# Patient Record
Sex: Female | Born: 1935 | Race: White | Hispanic: No | Smoking: Former smoker
Health system: Southern US, Community
[De-identification: ages and names within clinical notes are randomized; demographics above are authoritative.]

## PROBLEM LIST (undated history)

## (undated) DIAGNOSIS — J449 Chronic obstructive pulmonary disease, unspecified: Secondary | ICD-10-CM

## (undated) DIAGNOSIS — N289 Disorder of kidney and ureter, unspecified: Secondary | ICD-10-CM

## (undated) DIAGNOSIS — E119 Type 2 diabetes mellitus without complications: Secondary | ICD-10-CM

## (undated) DIAGNOSIS — I509 Heart failure, unspecified: Secondary | ICD-10-CM

## (undated) DIAGNOSIS — I219 Acute myocardial infarction, unspecified: Secondary | ICD-10-CM

## (undated) DIAGNOSIS — I639 Cerebral infarction, unspecified: Secondary | ICD-10-CM

## (undated) DIAGNOSIS — M1990 Unspecified osteoarthritis, unspecified site: Secondary | ICD-10-CM

## (undated) DIAGNOSIS — I10 Essential (primary) hypertension: Secondary | ICD-10-CM

## (undated) HISTORY — PX: CARPAL TUNNEL RELEASE: SHX101

## (undated) HISTORY — PX: CORONARY ARTERY BYPASS GRAFT: SHX141

## (undated) HISTORY — PX: BACK SURGERY: SHX140

## (undated) HISTORY — PX: ABDOMINAL HYSTERECTOMY: SHX81

---

## 2013-06-23 ENCOUNTER — Emergency Department (HOSPITAL_COMMUNITY)
Admission: EM | Admit: 2013-06-23 | Discharge: 2013-06-23 | Disposition: A | Discharge status: Home / Self Care | Payer: Medicare Other | Source: Home / Self Care | Attending: Emergency Medicine | Admitting: Emergency Medicine

## 2013-06-23 ENCOUNTER — Encounter (HOSPITAL_COMMUNITY): Payer: Self-pay | Admitting: Emergency Medicine

## 2013-06-23 DIAGNOSIS — Z7902 Long term (current) use of antithrombotics/antiplatelets: Secondary | ICD-10-CM | POA: Insufficient documentation

## 2013-06-23 DIAGNOSIS — J441 Chronic obstructive pulmonary disease with (acute) exacerbation: Secondary | ICD-10-CM | POA: Insufficient documentation

## 2013-06-23 DIAGNOSIS — Z9889 Other specified postprocedural states: Secondary | ICD-10-CM | POA: Insufficient documentation

## 2013-06-23 DIAGNOSIS — Z79899 Other long term (current) drug therapy: Secondary | ICD-10-CM | POA: Insufficient documentation

## 2013-06-23 DIAGNOSIS — Z8673 Personal history of transient ischemic attack (TIA), and cerebral infarction without residual deficits: Secondary | ICD-10-CM | POA: Insufficient documentation

## 2013-06-23 DIAGNOSIS — M129 Arthropathy, unspecified: Secondary | ICD-10-CM | POA: Insufficient documentation

## 2013-06-23 DIAGNOSIS — Z794 Long term (current) use of insulin: Secondary | ICD-10-CM | POA: Insufficient documentation

## 2013-06-23 DIAGNOSIS — I252 Old myocardial infarction: Secondary | ICD-10-CM | POA: Insufficient documentation

## 2013-06-23 DIAGNOSIS — E119 Type 2 diabetes mellitus without complications: Secondary | ICD-10-CM | POA: Insufficient documentation

## 2013-06-23 DIAGNOSIS — Z792 Long term (current) use of antibiotics: Secondary | ICD-10-CM | POA: Insufficient documentation

## 2013-06-23 DIAGNOSIS — L03313 Cellulitis of chest wall: Secondary | ICD-10-CM

## 2013-06-23 DIAGNOSIS — M549 Dorsalgia, unspecified: Secondary | ICD-10-CM | POA: Insufficient documentation

## 2013-06-23 DIAGNOSIS — I10 Essential (primary) hypertension: Secondary | ICD-10-CM | POA: Insufficient documentation

## 2013-06-23 DIAGNOSIS — I509 Heart failure, unspecified: Secondary | ICD-10-CM | POA: Insufficient documentation

## 2013-06-23 DIAGNOSIS — Z7982 Long term (current) use of aspirin: Secondary | ICD-10-CM | POA: Insufficient documentation

## 2013-06-23 DIAGNOSIS — Z87448 Personal history of other diseases of urinary system: Secondary | ICD-10-CM | POA: Insufficient documentation

## 2013-06-23 DIAGNOSIS — Z87891 Personal history of nicotine dependence: Secondary | ICD-10-CM | POA: Insufficient documentation

## 2013-06-23 DIAGNOSIS — L02219 Cutaneous abscess of trunk, unspecified: Secondary | ICD-10-CM | POA: Insufficient documentation

## 2013-06-23 DIAGNOSIS — M2550 Pain in unspecified joint: Secondary | ICD-10-CM | POA: Insufficient documentation

## 2013-06-23 HISTORY — DX: Unspecified osteoarthritis, unspecified site: M19.90

## 2013-06-23 HISTORY — DX: Heart failure, unspecified: I50.9

## 2013-06-23 HISTORY — DX: Disorder of kidney and ureter, unspecified: N28.9

## 2013-06-23 HISTORY — DX: Type 2 diabetes mellitus without complications: E11.9

## 2013-06-23 HISTORY — DX: Chronic obstructive pulmonary disease, unspecified: J44.9

## 2013-06-23 HISTORY — DX: Cerebral infarction, unspecified: I63.9

## 2013-06-23 HISTORY — DX: Essential (primary) hypertension: I10

## 2013-06-23 HISTORY — DX: Acute myocardial infarction, unspecified: I21.9

## 2013-06-23 LAB — CBC WITH DIFFERENTIAL/PLATELET
Basophils Absolute: 0.1 10*3/uL (ref 0.0–0.1)
Basophils Relative: 1 % (ref 0–1)
Eosinophils Absolute: 0.5 10*3/uL (ref 0.0–0.7)
MCH: 28.8 pg (ref 26.0–34.0)
MCHC: 32.1 g/dL (ref 30.0–36.0)
MCV: 89.5 fL (ref 78.0–100.0)
Monocytes Absolute: 1 10*3/uL (ref 0.1–1.0)
Neutrophils Relative %: 75 % (ref 43–77)
Platelets: 333 10*3/uL (ref 150–400)
RBC: 3.06 MIL/uL — ABNORMAL LOW (ref 3.87–5.11)
RDW: 14.5 % (ref 11.5–15.5)

## 2013-06-23 LAB — BASIC METABOLIC PANEL
CO2: 25 mEq/L (ref 19–32)
GFR calc Af Amer: 40 mL/min — ABNORMAL LOW (ref >90)
GFR calc non Af Amer: 35 mL/min — ABNORMAL LOW (ref >90)
Potassium: 4.8 mEq/L (ref 3.5–5.1)
Sodium: 135 mEq/L (ref 135–145)

## 2013-06-23 MED ORDER — DIPHENHYDRAMINE HCL 50 MG/ML IJ SOLN
12.5000 mg | Freq: Once | INTRAMUSCULAR | Status: AC
  Administered 2013-06-23: 12.5 mg via INTRAVENOUS
  Filled 2013-06-23: qty 1

## 2013-06-23 MED ORDER — MORPHINE SULFATE 4 MG/ML IJ SOLN: 4.0000 mg | Freq: Once | INTRAMUSCULAR | Status: DC

## 2013-06-23 MED ORDER — CLINDAMYCIN HCL 300 MG PO CAPS: 300.0000 mg | ORAL_CAPSULE | Freq: Four times a day (QID) | ORAL | Status: DC

## 2013-06-23 MED ORDER — CLINDAMYCIN HCL 150 MG PO CAPS
450.0000 mg | ORAL_CAPSULE | Freq: Once | ORAL | Status: AC
  Administered 2013-06-23: 450 mg via ORAL
  Filled 2013-06-23: qty 3

## 2013-06-23 MED ORDER — MORPHINE SULFATE 2 MG/ML IJ SOLN
2.0000 mg | Freq: Once | INTRAMUSCULAR | Status: AC
  Administered 2013-06-23: 2 mg via INTRAVENOUS
  Filled 2013-06-23: qty 1

## 2013-06-23 MED ORDER — HYDROCODONE-ACETAMINOPHEN 5-325 MG PO TABS: 1.0000 | ORAL_TABLET | ORAL | Status: DC | PRN

## 2013-06-23 NOTE — ED Provider Notes (Signed)
CSN: [id number]     Arrival date & time 06/23/13  1154 History   First MD Initiated Contact with Patient 06/23/13 1214     Chief Complaint  Patient presents with  . Mass   (Consider location/radiation/quality/duration/timing/severity/associated sxs/prior Treatment) HPI Comments: Patient is a 78 year old female who presents to the emergency department with complaint of a" mass" under her right arm. The patient states that over the past week she has noticed an increasing mass under the right arm. She states it is very painful to touch. She has some pain with certain movements of her arm in the axilla area. The patient denies fever. She's not sure about chills. He's not had nausea or vomiting are related to this. The patient has not had any operations or procedures involving the axilla. His been no drainage from this mass area. No history of injury or trauma reported.  The history is provided by the patient.    Past Medical History  Diagnosis Date  . Hypertension   . Diabetes mellitus without complication   . CHF (congestive heart failure)   . Stroke   . COPD (chronic obstructive pulmonary disease)   . Renal disorder   . Arthritis   . MI (myocardial infarction)     MI x2   Past Surgical History  Procedure Laterality Date  . Back surgery    . Cardiac surgery     History reviewed. No pertinent family history. History  Substance Use Topics  . Smoking status: Former Games developer  . Smokeless tobacco: Not on file  . Alcohol Use: No   OB History   Grav Para Term Preterm Abortions TAB SAB Ect Mult Living                 Review of Systems  Constitutional: Negative for activity change.       All ROS Neg except as noted in HPI  HENT: Negative for nosebleeds.   Eyes: Negative for photophobia and discharge.  Respiratory: Positive for shortness of breath. Negative for cough and wheezing.   Cardiovascular: Positive for chest pain. Negative for palpitations.  Gastrointestinal: Negative for  abdominal pain and blood in stool.  Genitourinary: Negative for dysuria, frequency and hematuria.  Musculoskeletal: Positive for arthralgias and back pain. Negative for neck pain.  Skin: Negative.   Neurological: Negative for dizziness, seizures and speech difficulty.  Psychiatric/Behavioral: Negative for hallucinations and confusion.    Allergies  Oxycodone; Feldene; and Tape  Home Medications   Current Outpatient Rx  Name  Route  Sig  Dispense  Refill  . acetaminophen (TYLENOL) 500 MG tablet   Oral   Take 1,000 mg by mouth every 8 (eight) hours as needed for mild pain or moderate pain.         Marland Kitchen amLODipine (NORVASC) 10 MG tablet      1 tablet daily.         Marland Kitchen aspirin EC 81 MG tablet   Oral   Take 81 mg by mouth daily.         Marland Kitchen atorvastatin (LIPITOR) 20 MG tablet      1 tablet daily.         . Cholecalciferol 5000 UNITS capsule   Oral   Take 5,000 Units by mouth daily.         . clopidogrel (PLAVIX) 75 MG tablet   Oral   Take 1 tablet by mouth daily.         . cyclobenzaprine (FLEXERIL) 5 MG tablet  Oral   Take 5 mg by mouth 3 (three) times daily as needed for muscle spasms.          . furosemide (LASIX) 80 MG tablet   Oral   Take 1 tablet by mouth daily.         . hydrALAZINE (APRESOLINE) 50 MG tablet   Oral   Take 1 tablet by mouth 3 (three) times daily.         Marland Kitchen HYDROcodone-acetaminophen (NORCO) 10-325 MG per tablet   Oral   Take 1-3 tablets by mouth daily.         . insulin glargine (LANTUS) 100 UNIT/ML injection   Subcutaneous   Inject 30 Units into the skin at bedtime.         . insulin lispro (HUMALOG) 100 UNIT/ML injection   Subcutaneous   Inject 6 Units into the skin 3 (three) times daily before meals.         Marland Kitchen levothyroxine (SYNTHROID, LEVOTHROID) 50 MCG tablet   Oral   Take 1 tablet by mouth daily.         . metoprolol tartrate (LOPRESSOR) 25 MG tablet   Oral   Take 1 tablet by mouth daily.         Marland Kitchen  neomycin-polymyxin b-dexamethasone (MAXITROL) 3.5-10000-0.1 OINT   Both Eyes   Place 1 application into both eyes 2 (two) times daily.         Marland Kitchen omeprazole (PRILOSEC) 20 MG capsule   Oral   Take 20 mg by mouth daily.         . sertraline (ZOLOFT) 25 MG tablet   Oral   Take 1 tablet by mouth daily.         . Vitamin D, Ergocalciferol, (DRISDOL) 50000 UNITS CAPS capsule   Oral   Take 50,000 Units by mouth every 7 (seven) days.         . vitamin E 400 UNIT capsule   Oral   Take 400 Units by mouth daily.         . clindamycin (CLEOCIN) 300 MG capsule   Oral   Take 1 capsule (300 mg total) by mouth 4 (four) times daily.   28 capsule   0   . HYDROcodone-acetaminophen (NORCO/VICODIN) 5-325 MG per tablet   Oral   Take 1 tablet by mouth every 4 (four) hours as needed for moderate pain.   15 tablet   0    BP 174/69  Pulse 68  Temp(Src) 98 F (36.7 C) (Oral)  Resp 18  Ht 5\' 2"  (1.575 m)  Wt 207 lb (93.895 kg)  BMI 37.85 kg/m2  SpO2 97% Physical Exam  Nursing note and vitals reviewed. Constitutional: She is oriented to person, place, and time. She appears well-developed and well-nourished.  Non-toxic appearance.  HENT:  Head: Normocephalic.  Right Ear: Tympanic membrane and external ear normal.  Left Ear: Tympanic membrane and external ear normal.  Eyes: EOM and lids are normal. Pupils are equal, round, and reactive to light.  Neck: Normal range of motion. Neck supple. Carotid bruit is not present.  Cardiovascular: Normal rate, regular rhythm, normal heart sounds, intact distal pulses and normal pulses.   Pulmonary/Chest: Breath sounds normal. No respiratory distress.  There is increased redness of the right axilla that extends to the right lateral breast. There is no nipple involvement. Is no drainage or discharge from the nipple area. The axilla area is tender.  There is a palpable mass of the axilla.  This is tender to palpation. The mass is movable. Tender and  warm to touch.  Abdominal: Soft. Bowel sounds are normal. There is no tenderness. There is no guarding.  Musculoskeletal: Normal range of motion.  Lymphadenopathy:       Head (right side): No submandibular adenopathy present.       Head (left side): No submandibular adenopathy present.    She has no cervical adenopathy.  Neurological: She is alert and oriented to person, place, and time. She has normal strength. No cranial nerve deficit or sensory deficit.  Skin: Skin is warm and dry.  Psychiatric: She has a normal mood and affect. Her speech is normal.    ED Course  Procedures (including critical care time) Labs Review Labs Reviewed  CBC WITH DIFFERENTIAL - Abnormal; Notable for the following:    WBC 15.5 (*)    RBC 3.06 (*)    Hemoglobin 8.8 (*)    HCT 27.4 (*)    Neutro Abs 11.6 (*)    All other components within normal limits  BASIC METABOLIC PANEL - Abnormal; Notable for the following:    Glucose, Bld 132 (*)    BUN 42 (*)    Creatinine, Ser 1.42 (*)    GFR calc non Af Amer 35 (*)    GFR calc Af Amer 40 (*)    All other components within normal limits   Imaging Review No results found.  EKG Interpretation   None       MDM   1. Cellulitis of chest wall    **I have reviewed nursing notes, vital signs, and all appropriate lab and imaging results for this patient.*   metabolic panel reveals the glucose to be elevated at 132, BUN elevated at 42, creatinine elevated at 1.42. The complete blood count shows a white blood cells elevated at 15,500, the hemoglobin is low at 8.8, hematocrit low at 27.4.  Question if the mass of the axilla is an abscess versus lymph nodes, versus mass. Patient seen with me by Dr. Patria Mane. Ultrasound suggests probably swollen lymph nodes in the setting of cellulitis.  Clindamycin 450 mg given to the patient for treatment of cellulitis. Prescription for Norco and clindamycin 300 mg 4 times daily given to the patient by Dr. Patria Mane. Patient is to  return in 36 hours for recheck of the cellulitis area. The patient has had the cellulitic area marked with skin markers. Patient is to return sooner if any problems or complications.  Kathie Dike, PA-C 06/23/13 1453

## 2013-06-23 NOTE — ED Notes (Addendum)
Pt on Chronic O2 3L[place], did not bring her O2 with her, taken to room and placed on O2 3L [place]. Pt's sats in triage 78% on RA, pt has softball mass under rt arm x 1 week. Pt sats improved to 95% on O2.

## 2013-06-23 NOTE — ED Notes (Signed)
Per physicians order, area under right arm and also around breast was outlined with skin marker. Area still read swollen and warm at this time. Patient in NAD. Will continue to monitor patient.

## 2013-06-23 NOTE — ED Provider Notes (Signed)
Medical screening examination/treatment/procedure(s) were conducted as a shared visit with non-physician practitioner(s) and myself.  I personally evaluated the patient during the encounter.  EKG Interpretation   None       This appears to be right lateral chest wall,axilla and right breast cellulitis. Suspected reactive lymphnodes in right axilla. abx and warm compresses. 36 hour follow up in the ER for recheck or sooner as needed  EMERGENCY DEPARTMENT US SOFT TISSUE INTERPRETATION "Study: Limited Ultrasound of the noted body part in comments below" INDICATIONS: Soft tissue infection Multiple views of the body part are obtained with a multi-frequency linear probe PERFORMED BY:  Myself IMAGES ARCHIVED?: No SIDE:Right  BODY PART:Chest Wall FINDINGS: No abcess noted LIMITATIONS:  none INTERPRETATION:  No abcess noted COMMENT:      Lyanne Co, MD 06/23/13 [phone]

## 2013-06-25 ENCOUNTER — Inpatient Hospital Stay (HOSPITAL_COMMUNITY)
Admission: EM | Admit: 2013-06-25 | Discharge: 2013-06-27 | DRG: 603 | Disposition: A | Discharge status: Home / Self Care | Payer: Medicare Other | Attending: Family Medicine | Admitting: Family Medicine

## 2013-06-25 ENCOUNTER — Encounter (HOSPITAL_COMMUNITY): Payer: Self-pay | Admitting: Emergency Medicine

## 2013-06-25 DIAGNOSIS — K219 Gastro-esophageal reflux disease without esophagitis: Secondary | ICD-10-CM | POA: Diagnosis present

## 2013-06-25 DIAGNOSIS — E119 Type 2 diabetes mellitus without complications: Secondary | ICD-10-CM

## 2013-06-25 DIAGNOSIS — J9610 Chronic respiratory failure, unspecified whether with hypoxia or hypercapnia: Secondary | ICD-10-CM | POA: Diagnosis present

## 2013-06-25 DIAGNOSIS — I2510 Atherosclerotic heart disease of native coronary artery without angina pectoris: Secondary | ICD-10-CM | POA: Diagnosis present

## 2013-06-25 DIAGNOSIS — Z8249 Family history of ischemic heart disease and other diseases of the circulatory system: Secondary | ICD-10-CM

## 2013-06-25 DIAGNOSIS — Z87891 Personal history of nicotine dependence: Secondary | ICD-10-CM

## 2013-06-25 DIAGNOSIS — E875 Hyperkalemia: Secondary | ICD-10-CM | POA: Diagnosis present

## 2013-06-25 DIAGNOSIS — D638 Anemia in other chronic diseases classified elsewhere: Secondary | ICD-10-CM | POA: Diagnosis present

## 2013-06-25 DIAGNOSIS — Z951 Presence of aortocoronary bypass graft: Secondary | ICD-10-CM

## 2013-06-25 DIAGNOSIS — N61 Mastitis without abscess: Secondary | ICD-10-CM

## 2013-06-25 DIAGNOSIS — L0390 Cellulitis, unspecified: Secondary | ICD-10-CM | POA: Diagnosis present

## 2013-06-25 DIAGNOSIS — I129 Hypertensive chronic kidney disease with stage 1 through stage 4 chronic kidney disease, or unspecified chronic kidney disease: Secondary | ICD-10-CM | POA: Diagnosis present

## 2013-06-25 DIAGNOSIS — M129 Arthropathy, unspecified: Secondary | ICD-10-CM | POA: Diagnosis present

## 2013-06-25 DIAGNOSIS — I509 Heart failure, unspecified: Secondary | ICD-10-CM | POA: Diagnosis present

## 2013-06-25 DIAGNOSIS — Z8673 Personal history of transient ischemic attack (TIA), and cerebral infarction without residual deficits: Secondary | ICD-10-CM

## 2013-06-25 DIAGNOSIS — J4489 Other specified chronic obstructive pulmonary disease: Secondary | ICD-10-CM | POA: Diagnosis present

## 2013-06-25 DIAGNOSIS — J449 Chronic obstructive pulmonary disease, unspecified: Secondary | ICD-10-CM | POA: Diagnosis present

## 2013-06-25 DIAGNOSIS — I252 Old myocardial infarction: Secondary | ICD-10-CM

## 2013-06-25 DIAGNOSIS — L03119 Cellulitis of unspecified part of limb: Secondary | ICD-10-CM | POA: Diagnosis present

## 2013-06-25 DIAGNOSIS — Z794 Long term (current) use of insulin: Secondary | ICD-10-CM

## 2013-06-25 DIAGNOSIS — IMO0002 Reserved for concepts with insufficient information to code with codable children: Principal | ICD-10-CM

## 2013-06-25 DIAGNOSIS — R1310 Dysphagia, unspecified: Secondary | ICD-10-CM | POA: Diagnosis present

## 2013-06-25 DIAGNOSIS — Z7982 Long term (current) use of aspirin: Secondary | ICD-10-CM

## 2013-06-25 DIAGNOSIS — N189 Chronic kidney disease, unspecified: Secondary | ICD-10-CM

## 2013-06-25 LAB — CBC WITH DIFFERENTIAL/PLATELET
Basophils Relative: 1 % (ref 0–1)
Eosinophils Absolute: 0.6 10*3/uL (ref 0.0–0.7)
HCT: 27.1 % — ABNORMAL LOW (ref 36.0–46.0)
Hemoglobin: 8.7 g/dL — ABNORMAL LOW (ref 12.0–15.0)
Lymphocytes Relative: 21 % (ref 12–46)
Lymphs Abs: 3 10*3/uL (ref 0.7–4.0)
MCHC: 32.1 g/dL (ref 30.0–36.0)
MCV: 88.3 fL (ref 78.0–100.0)
Monocytes Relative: 8 % (ref 3–12)
Neutro Abs: 9.3 10*3/uL — ABNORMAL HIGH (ref 1.7–7.7)
Neutrophils Relative %: 66 % (ref 43–77)
RBC: 3.07 MIL/uL — ABNORMAL LOW (ref 3.87–5.11)

## 2013-06-25 LAB — GLUCOSE, CAPILLARY: Glucose-Capillary: 175 mg/dL — ABNORMAL HIGH (ref 70–99)

## 2013-06-25 LAB — BASIC METABOLIC PANEL
BUN: 40 mg/dL — ABNORMAL HIGH (ref 6–23)
CO2: 27 mEq/L (ref 19–32)
Chloride: 95 mEq/L — ABNORMAL LOW (ref 96–112)
Creatinine, Ser: 1.65 mg/dL — ABNORMAL HIGH (ref 0.50–1.10)
GFR calc Af Amer: 34 mL/min — ABNORMAL LOW (ref >90)
Potassium: 4.5 mEq/L (ref 3.5–5.1)

## 2013-06-25 MED ORDER — SODIUM CHLORIDE 0.9 % IJ SOLN: 3.0000 mL | INTRAMUSCULAR | Status: DC | PRN

## 2013-06-25 MED ORDER — CYCLOBENZAPRINE HCL 10 MG PO TABS: 5.0000 mg | ORAL_TABLET | Freq: Three times a day (TID) | ORAL | Status: DC | PRN

## 2013-06-25 MED ORDER — AMLODIPINE BESYLATE 5 MG PO TABS
10.0000 mg | ORAL_TABLET | Freq: Every day | ORAL | Status: DC
  Administered 2013-06-25 – 2013-06-27 (×3): 10 mg via ORAL
  Filled 2013-06-25 (×3): qty 2

## 2013-06-25 MED ORDER — ONDANSETRON HCL 4 MG PO TABS
4.0000 mg | ORAL_TABLET | Freq: Four times a day (QID) | ORAL | Status: DC | PRN
  Administered 2013-06-25 – 2013-06-27 (×2): 4 mg via ORAL
  Filled 2013-06-25 (×2): qty 1

## 2013-06-25 MED ORDER — PANTOPRAZOLE SODIUM 40 MG PO TBEC
40.0000 mg | DELAYED_RELEASE_TABLET | Freq: Every day | ORAL | Status: DC
  Administered 2013-06-25 – 2013-06-26 (×2): 40 mg via ORAL
  Filled 2013-06-25: qty 1

## 2013-06-25 MED ORDER — NEOMYCIN-POLYMYXIN-DEXAMETH 3.5-10000-0.1 OP OINT
1.0000 "application " | TOPICAL_OINTMENT | Freq: Two times a day (BID) | OPHTHALMIC | Status: DC
  Administered 2013-06-25 – 2013-06-27 (×4): 1 via OPHTHALMIC
  Filled 2013-06-25: qty 3.5

## 2013-06-25 MED ORDER — ACETAMINOPHEN 325 MG PO TABS
650.0000 mg | ORAL_TABLET | Freq: Four times a day (QID) | ORAL | Status: DC | PRN
  Administered 2013-06-26 (×2): 650 mg via ORAL
  Filled 2013-06-25 (×2): qty 2

## 2013-06-25 MED ORDER — HEPARIN SODIUM (PORCINE) 5000 UNIT/ML IJ SOLN
5000.0000 [IU] | Freq: Three times a day (TID) | INTRAMUSCULAR | Status: DC
Start: 2013-06-25 — End: 2013-06-27
  Administered 2013-06-25 (×2): 5000 [IU] via SUBCUTANEOUS
  Filled 2013-06-25 (×4): qty 1

## 2013-06-25 MED ORDER — INSULIN ASPART 100 UNIT/ML ~~LOC~~ SOLN: 0.0000 [IU] | Freq: Every day | SUBCUTANEOUS | Status: DC

## 2013-06-25 MED ORDER — ACETAMINOPHEN 650 MG RE SUPP: 650.0000 mg | Freq: Four times a day (QID) | RECTAL | Status: DC | PRN

## 2013-06-25 MED ORDER — ONDANSETRON HCL 4 MG/2ML IJ SOLN: 4.0000 mg | Freq: Four times a day (QID) | INTRAMUSCULAR | Status: DC | PRN

## 2013-06-25 MED ORDER — SERTRALINE HCL 50 MG PO TABS
25.0000 mg | ORAL_TABLET | Freq: Every day | ORAL | Status: DC
  Administered 2013-06-25 – 2013-06-27 (×3): 25 mg via ORAL
  Filled 2013-06-25 (×3): qty 1

## 2013-06-25 MED ORDER — HYDROCODONE-ACETAMINOPHEN 10-325 MG PO TABS
1.0000 | ORAL_TABLET | Freq: Four times a day (QID) | ORAL | Status: DC | PRN
  Administered 2013-06-25 – 2013-06-26 (×3): 1 via ORAL
  Filled 2013-06-25 (×3): qty 1

## 2013-06-25 MED ORDER — HYDRALAZINE HCL 25 MG PO TABS
50.0000 mg | ORAL_TABLET | Freq: Three times a day (TID) | ORAL | Status: DC
  Administered 2013-06-25 – 2013-06-27 (×6): 50 mg via ORAL
  Filled 2013-06-25 (×6): qty 2

## 2013-06-25 MED ORDER — ASPIRIN EC 81 MG PO TBEC
81.0000 mg | DELAYED_RELEASE_TABLET | Freq: Every day | ORAL | Status: DC
  Administered 2013-06-25 – 2013-06-27 (×3): 81 mg via ORAL
  Filled 2013-06-25 (×3): qty 1

## 2013-06-25 MED ORDER — INSULIN ASPART 100 UNIT/ML ~~LOC~~ SOLN
0.0000 [IU] | Freq: Three times a day (TID) | SUBCUTANEOUS | Status: DC
  Administered 2013-06-25: 3 [IU] via SUBCUTANEOUS
  Administered 2013-06-26: 2 [IU] via SUBCUTANEOUS
  Administered 2013-06-26: 1 [IU] via SUBCUTANEOUS
  Administered 2013-06-27: 2 [IU] via SUBCUTANEOUS
  Administered 2013-06-27: 3 [IU] via SUBCUTANEOUS

## 2013-06-25 MED ORDER — LEVOTHYROXINE SODIUM 50 MCG PO TABS
50.0000 ug | ORAL_TABLET | Freq: Every day | ORAL | Status: DC
  Administered 2013-06-26 – 2013-06-27 (×2): 50 ug via ORAL
  Filled 2013-06-25 (×2): qty 1

## 2013-06-25 MED ORDER — FUROSEMIDE 80 MG PO TABS
80.0000 mg | ORAL_TABLET | Freq: Every day | ORAL | Status: DC
  Administered 2013-06-25 – 2013-06-27 (×3): 80 mg via ORAL
  Filled 2013-06-25 (×3): qty 2

## 2013-06-25 MED ORDER — METOPROLOL TARTRATE 25 MG PO TABS
25.0000 mg | ORAL_TABLET | Freq: Every day | ORAL | Status: DC
  Administered 2013-06-25 – 2013-06-27 (×3): 25 mg via ORAL
  Filled 2013-06-25 (×3): qty 1

## 2013-06-25 MED ORDER — INSULIN GLARGINE 100 UNIT/ML ~~LOC~~ SOLN
30.0000 [IU] | Freq: Every day | SUBCUTANEOUS | Status: DC
  Administered 2013-06-25 – 2013-06-26 (×2): 30 [IU] via SUBCUTANEOUS
  Filled 2013-06-25 (×3): qty 0.3

## 2013-06-25 MED ORDER — ATORVASTATIN CALCIUM 20 MG PO TABS
20.0000 mg | ORAL_TABLET | Freq: Every day | ORAL | Status: DC
  Administered 2013-06-25 – 2013-06-27 (×3): 20 mg via ORAL
  Filled 2013-06-25 (×3): qty 1

## 2013-06-25 MED ORDER — VANCOMYCIN HCL IN DEXTROSE 1-5 GM/200ML-% IV SOLN
1000.0000 mg | INTRAVENOUS | Status: DC
  Administered 2013-06-25 – 2013-06-26 (×2): 1000 mg via INTRAVENOUS
  Filled 2013-06-25 (×4): qty 200

## 2013-06-25 MED ORDER — SODIUM CHLORIDE 0.9 % IJ SOLN
3.0000 mL | Freq: Two times a day (BID) | INTRAMUSCULAR | Status: DC
  Administered 2013-06-25 – 2013-06-27 (×3): 3 mL via INTRAVENOUS

## 2013-06-25 MED ORDER — POLYETHYLENE GLYCOL 3350 17 G PO PACK: 17.0000 g | PACK | Freq: Every day | ORAL | Status: DC | PRN

## 2013-06-25 MED ORDER — INSULIN ASPART 100 UNIT/ML ~~LOC~~ SOLN
6.0000 [IU] | Freq: Three times a day (TID) | SUBCUTANEOUS | Status: DC
  Administered 2013-06-25 – 2013-06-26 (×3): 6 [IU] via SUBCUTANEOUS

## 2013-06-25 MED ORDER — SODIUM CHLORIDE 0.9 % IV SOLN: 250.0000 mL | INTRAVENOUS | Status: DC | PRN

## 2013-06-25 NOTE — ED Notes (Signed)
Pt seen in ED wed 12/10 and given iv antibiotics for right axilla abscess. Pt here for recheck.

## 2013-06-25 NOTE — Progress Notes (Signed)
ANTIBIOTIC CONSULT NOTE - INITIAL  Pharmacy Consult for Vancomycin Indication: cellulitis  Allergies  Allergen Reactions  . Oxycodone Itching  . Feldene [Piroxicam] Rash  . Tape Rash    Patient Measurements: Height: 5\' 2"  (157.5 cm) Weight: 207 lb (93.895 kg) IBW/kg (Calculated) : 50.1  Vital Signs: Temp: 98.2 F (36.8 C) (12/12 0849) BP: 151/38 mmHg (12/12 1045) Pulse Rate: 60 (12/12 1045) Intake/Output from previous day:   Intake/Output from this shift:    Labs:  Recent Labs  06/23/13 1220 06/25/13 0907  WBC 15.5* 14.1*  HGB 8.8* 8.7*  PLT 333 327  CREATININE 1.42* 1.65*   Estimated Creatinine Clearance: 31 ml/min (by C-G formula based on Cr of 1.65). No results found for this basename: VANCOTROUGH, VANCOPEAK, VANCORANDOM, GENTTROUGH, GENTPEAK, GENTRANDOM, TOBRATROUGH, TOBRAPEAK, TOBRARND, AMIKACINPEAK, AMIKACINTROU, AMIKACIN,  in the last 72 hours   Microbiology: No results found for this or any previous visit (from the past 720 hour(s)).  Medical History: Past Medical History  Diagnosis Date  . Hypertension   . Diabetes mellitus without complication   . CHF (congestive heart failure)     type unknown  . Stroke     eyesight affected, uses a cane  . COPD (chronic obstructive pulmonary disease)     3L [place] "PRN"  . Renal disorder   . Arthritis   . MI (myocardial infarction)     MI x2    Medications:  Scheduled:  . amLODipine  10 mg Oral Daily  . aspirin EC  81 mg Oral Daily  . atorvastatin  20 mg Oral Daily  . furosemide  80 mg Oral Daily  . heparin  5,000 Units Subcutaneous Q8H  . hydrALAZINE  50 mg Oral TID  . insulin aspart  0-5 Units Subcutaneous QHS  . insulin aspart  0-9 Units Subcutaneous TID WC  . insulin aspart  6 Units Subcutaneous TID WC  . insulin glargine  30 Units Subcutaneous QHS  . [START ON 06/26/2013] levothyroxine  50 mcg Oral QAC breakfast  . metoprolol tartrate  25 mg Oral Daily  . neomycin-polymyxin b-dexamethasone  1  application Both Eyes BID  . pantoprazole  40 mg Oral Daily  . sertraline  25 mg Oral Daily  . sodium chloride  3 mL Intravenous Q12H  . vancomycin  1,000 mg Intravenous Q24H   Assessment: 78 yo F with right axilla & breast cellulitis that has improved on oral Clindamycin.  She was admitted for IV antibiotics because of new forearm cellulitis.  Imaging negative for abscess.  Scr is increased from 2 days ago (1.42>>1.65).   Vancomycin 12/12>> Clindamycin 12/10>>12/12  Goal of Therapy:  Vancomycin trough level 10-15 mcg/ml  Plan:  Vancomycin 1gm IV q24h Check Vancomycin trough at steady state Monitor renal function and cx data   Elson Clan 06/25/2013,1:10 PM

## 2013-06-25 NOTE — Progress Notes (Signed)
Utilization Review Complete  

## 2013-06-25 NOTE — H&P (Signed)
History and Physical  Ann Harrison ZOX:[id number] DOB: 1935-05-20 DOA: 06/25/2013  Referring physician: Dr. Rubin Payor in ED PCP: Arlina Robes, MD in [place]  Chief Complaint: Recheck of infection  HPI:  78 year old woman who receives all her care in [place] seen in the emergency Department 12/10 for right axilla and breast cellulitis, placed on clindamycin. Imaging at that time was negative for abscess. She presented to the emergency department today for wound recheck. Although the area of the breast and the axilla and to improve, she appeared to have forearm cellulitis and was thus referred for admission.  She reports she has been compliant with clindamycin. She reports scratching her right forearm but no other injury. No history of serious infection. Symptoms began with itching of the breast and axilla and then the forearm within the last week. She has pain in the forearm.  In the emergency department and be afebrile with stable vital signs. Basic metabolic panel notable for creatinine 1.65, BUN 40, stable. Persistent leukocytosis, 14.1. Hemoglobin stable at 8.7 compared to laboratory studies obtained 2 days ago.  Review of Systems:  As in history of present illness  Past Medical History  Diagnosis Date  . Hypertension   . Diabetes mellitus without complication   . CHF (congestive heart failure)     type unknown  . Stroke     eyesight affected, uses a cane  . COPD (chronic obstructive pulmonary disease)     3L [place] "PRN"  . Renal disorder   . Arthritis   . MI (myocardial infarction)     MI x2    Past Surgical History  Procedure Laterality Date  . Back surgery      lumbar  . Coronary artery bypass graft      x3  . Abdominal hysterectomy    . Carpal tunnel release      right    Social History:  reports that she has quit smoking. She does not have any smokeless tobacco history on file. She reports that she does not drink alcohol or use illicit drugs.  Allergies   Allergen Reactions  . Oxycodone Itching  . Feldene [Piroxicam] Rash  . Tape Rash  tolerates Lortab  Family History  Problem Relation Age of Onset  . Heart attack Father   . Cancer Sister      Prior to Admission medications   Medication Sig Start Date End Date Taking? Authorizing Provider  acetaminophen (TYLENOL) 500 MG tablet Take 1,000 mg by mouth every 8 (eight) hours as needed for mild pain or moderate pain.   Yes Historical Provider, MD  amLODipine (NORVASC) 10 MG tablet 1 tablet daily. 06/04/13  Yes Historical Provider, MD  aspirin EC 81 MG tablet Take 81 mg by mouth daily.   Yes Historical Provider, MD  atorvastatin (LIPITOR) 20 MG tablet 1 tablet daily. 06/04/13  Yes Historical Provider, MD  Cholecalciferol 5000 UNITS capsule Take 5,000 Units by mouth daily.   Yes Historical Provider, MD  clindamycin (CLEOCIN) 300 MG capsule Take 1 capsule (300 mg total) by mouth 4 (four) times daily. 06/23/13  Yes Lyanne Co, MD  clopidogrel (PLAVIX) 75 MG tablet Take 1 tablet by mouth daily. 06/16/13  Yes Historical Provider, MD  cyclobenzaprine (FLEXERIL) 5 MG tablet Take 5 mg by mouth 3 (three) times daily as needed for muscle spasms.    Yes Historical Provider, MD  furosemide (LASIX) 80 MG tablet Take 1 tablet by mouth daily. 05/03/13  Yes Historical Provider, MD  hydrALAZINE (APRESOLINE) 50  MG tablet Take 1 tablet by mouth 3 (three) times daily. 06/04/13  Yes Historical Provider, MD  HYDROcodone-acetaminophen (NORCO) 10-325 MG per tablet Take 1-3 tablets by mouth daily. 06/04/13  Yes Historical Provider, MD  insulin glargine (LANTUS) 100 UNIT/ML injection Inject 30 Units into the skin at bedtime.   Yes Historical Provider, MD  insulin lispro (HUMALOG) 100 UNIT/ML injection Inject 6 Units into the skin 3 (three) times daily before meals.   Yes Historical Provider, MD  levothyroxine (SYNTHROID, LEVOTHROID) 50 MCG tablet Take 1 tablet by mouth daily. 06/04/13  Yes Historical Provider, MD   metoprolol tartrate (LOPRESSOR) 25 MG tablet Take 1 tablet by mouth daily. 06/04/13  Yes Historical Provider, MD  omeprazole (PRILOSEC) 20 MG capsule Take 20 mg by mouth daily.   Yes Historical Provider, MD  sertraline (ZOLOFT) 25 MG tablet Take 1 tablet by mouth daily. 06/04/13  Yes Historical Provider, MD  Vitamin D, Ergocalciferol, (DRISDOL) 50000 UNITS CAPS capsule Take 50,000 Units by mouth every 7 (seven) days.   Yes Historical Provider, MD  vitamin E 400 UNIT capsule Take 400 Units by mouth daily.   Yes Historical Provider, MD  neomycin-polymyxin b-dexamethasone (MAXITROL) 3.5-10000-0.1 OINT Place 1 application into both eyes 2 (two) times daily. 05/25/13   Historical Provider, MD   Physical Exam: Filed Vitals:   06/25/13 0847 06/25/13 0849 06/25/13 1045  BP:  138/96 151/38  Pulse:  81 60  Temp:  98.2 F (36.8 C)   Resp:  18 20  Height: 5\' 2"  (1.575 m)    Weight: 93.895 kg (207 lb)    SpO2:  97% 100%   General: Examined in the emergency department.  Appears calm and comfortable Eyes: PERRL, normal lids, irises ENT: grossly normal hearing, lips & tongue Neck: no LAD, masses or thyromegaly Cardiovascular: RRR, no m/r/g. No LE edema. Respiratory: CTA bilaterally, no w/r/r. Normal respiratory effort. Abdomen: soft, ntnd Skin: Examined with Amber, ED RN as chaperone. The right breast and axilla have been marked and there is obvious regression of erythema from borders. The breast cellulitis appears nearly resolved. There is no mass or fluctuance. There is an area of induration in the axilla which is somewhat tender but there is no fluctuance. Family member at bedside reports that this area is improved. Right upper arm appears unremarkable. Right hand appears unremarkable with grossly normal sensation. Normal radial pulse. Hand is warm and dry. The right forearm has some erythema overlying chronic skin changes as well as some bruising, however the arm is clearly warmer than the  contralateral. There is no abscess, there is mild tenderness. No fluctuance. Musculoskeletal: grossly normal tone BUE/BLE Psychiatric: grossly normal mood and affect, speech fluent and appropriate Neurologic: grossly non-focal.  Wt Readings from Last 3 Encounters:  06/25/13 93.895 kg (207 lb)  06/23/13 93.895 kg (207 lb)    Labs on Admission:  Basic Metabolic Panel:  Recent Labs Lab 06/23/13 1220 06/25/13 0907  NA 135 135  K 4.8 4.5  CL 96 95*  CO2 25 27  GLUCOSE 132* 172*  BUN 42* 40*  CREATININE 1.42* 1.65*  CALCIUM 9.2 9.3    CBC:  Recent Labs Lab 06/23/13 1220 06/25/13 0907  WBC 15.5* 14.1*  NEUTROABS 11.6* 9.3*  HGB 8.8* 8.7*  HCT 27.4* 27.1*  MCV 89.5 88.3  PLT 333 327   Principal Problem:   Cellulitis and abscess of upper arm and forearm Active Problems:   Cellulitis   Cellulitis of breast   DM type  2 (diabetes mellitus, type 2)   CKD (chronic kidney disease)   Assessment/Plan 1. Right axilla, breast, forearm cellulitis: No evidence of complicating features at this point. It appears that the breast and axilla cellulitis is improving. Forearm cellulitis appears mild. No evidence of compartment syndrome. Empiric antibiotics. Given the development of forearm cellulitis while on clindamycin and improvement of breast cellulitis, empiric vancomycin. This appears to be gram-positive infection. 2. Diabetes mellitus presumed type II: Stable. Continue Lantus, meal coverage. Sliding-scale insulin. 3. COPD, chronic respiratory failure: Continue oxygen 3 L per minute nasal cannula. 4. Chronic kidney disease, stage unknown: Suspected baseline. Monitor clinically. 5. Normocytic anemia, likely anemia of chronic disease. Repeat CBC in the morning. 6. History of coronary artery disease, stroke, CHF type unknown: Appears stable. She reports them placement 10 or more years ago. Continue Lipitor, aspirin. Resume Plavix when clearing the procedure will be needed.  Code  Status: full code  DVT prophylaxis:heparin Family Communication: Discussed with substernal at bedside Disposition Plan/Anticipated LOS: Admit, 2 days  Time spent: 55 minutes  Brendia Sacks, MD  [facility] Pager [phone] 06/25/2013, 12:21 PM

## 2013-06-25 NOTE — ED Provider Notes (Signed)
Medical screening examination/treatment/procedure(s) were conducted as a shared visit with non-physician practitioner(s) and myself.  I personally evaluated the patient during the encounter.  EKG Interpretation   None       Cellulitis coming across the breast. Suspect swollen areas in right axilla are reactive lymph nodes. No indications for I and D at this time. Bedside US used and no focal fluid collection noted. 36 hour follow up in ER  Lyanne Co, MD 06/25/13 2308

## 2013-06-25 NOTE — ED Provider Notes (Signed)
CSN: [id number]     Arrival date & time 06/25/13  4098 History   First MD Initiated Contact with Patient 06/25/13 0844     This chart was scribed for [facility] R. Rubin Payor, MD by Ellin Mayhew, ED Scribe. This patient was seen in room APA19/APA19 and the patient's care was started at 8:54 AM.  Chief Complaint  Patient presents with  . Abscess   (Consider location/radiation/quality/duration/timing/severity/associated sxs/prior Treatment)  The history is provided by the patient and a relative. No language interpreter was used.    HPI Comments: Ann Harrison is a 78 y.o. female who presents to the Emergency Department to recheck for a mass to the right axilla from two days ago. She has been given antibtiotics Her relative states that it is currently less red than it was originally; however, patient reports feeling nauseated and having indigestion since starting the antibiotics. She also denies that the size or pain have improved. She reports a possible fever last night. She denies any diarrhea. Patient denies any prior episodes.    Patient also describes a rash to the Rforearm that has since developed and is causing her concern. Patient states that the rash has been itchy and has been scratching at the area.    Past Medical History  Diagnosis Date  . Hypertension   . Diabetes mellitus without complication   . CHF (congestive heart failure)   . Stroke   . COPD (chronic obstructive pulmonary disease)   . Renal disorder   . Arthritis   . MI (myocardial infarction)     MI x2   Past Surgical History  Procedure Laterality Date  . Back surgery    . Cardiac surgery     No family history on file. History  Substance Use Topics  . Smoking status: Former Games developer  . Smokeless tobacco: Not on file  . Alcohol Use: No   OB History   Grav Para Term Preterm Abortions TAB SAB Ect Mult Living                 Review of Systems  Constitutional: Positive for fever.  Gastrointestinal: Positive  for nausea. Negative for diarrhea.       Indigestion  Skin: Positive for color change.  All other systems reviewed and are negative.    Allergies  Oxycodone; Feldene; and Tape  Home Medications   Current Outpatient Rx  Name  Route  Sig  Dispense  Refill  . acetaminophen (TYLENOL) 500 MG tablet   Oral   Take 1,000 mg by mouth every 8 (eight) hours as needed for mild pain or moderate pain.         Marland Kitchen amLODipine (NORVASC) 10 MG tablet      1 tablet daily.         Marland Kitchen aspirin EC 81 MG tablet   Oral   Take 81 mg by mouth daily.         Marland Kitchen atorvastatin (LIPITOR) 20 MG tablet      1 tablet daily.         . Cholecalciferol 5000 UNITS capsule   Oral   Take 5,000 Units by mouth daily.         . clindamycin (CLEOCIN) 300 MG capsule   Oral   Take 1 capsule (300 mg total) by mouth 4 (four) times daily.   28 capsule   0   . clopidogrel (PLAVIX) 75 MG tablet   Oral   Take 1 tablet by mouth daily.         Marland Kitchen  cyclobenzaprine (FLEXERIL) 5 MG tablet   Oral   Take 5 mg by mouth 3 (three) times daily as needed for muscle spasms.          . furosemide (LASIX) 80 MG tablet   Oral   Take 1 tablet by mouth daily.         . hydrALAZINE (APRESOLINE) 50 MG tablet   Oral   Take 1 tablet by mouth 3 (three) times daily.         Marland Kitchen HYDROcodone-acetaminophen (NORCO) 10-325 MG per tablet   Oral   Take 1-3 tablets by mouth daily.         . insulin glargine (LANTUS) 100 UNIT/ML injection   Subcutaneous   Inject 30 Units into the skin at bedtime.         . insulin lispro (HUMALOG) 100 UNIT/ML injection   Subcutaneous   Inject 6 Units into the skin 3 (three) times daily before meals.         Marland Kitchen levothyroxine (SYNTHROID, LEVOTHROID) 50 MCG tablet   Oral   Take 1 tablet by mouth daily.         . metoprolol tartrate (LOPRESSOR) 25 MG tablet   Oral   Take 1 tablet by mouth daily.         Marland Kitchen omeprazole (PRILOSEC) 20 MG capsule   Oral   Take 20 mg by mouth  daily.         . sertraline (ZOLOFT) 25 MG tablet   Oral   Take 1 tablet by mouth daily.         . Vitamin D, Ergocalciferol, (DRISDOL) 50000 UNITS CAPS capsule   Oral   Take 50,000 Units by mouth every 7 (seven) days.         . vitamin E 400 UNIT capsule   Oral   Take 400 Units by mouth daily.         Marland Kitchen neomycin-polymyxin b-dexamethasone (MAXITROL) 3.5-10000-0.1 OINT   Both Eyes   Place 1 application into both eyes 2 (two) times daily.          Triage Vitals: BP 138/96  Pulse 81  Temp(Src) 98.2 F (36.8 C)  Resp 18  Ht 5\' 2"  (1.575 m)  Wt 207 lb (93.895 kg)  BMI 37.85 kg/m2  SpO2 97% Physical Exam  Nursing note and vitals reviewed. Constitutional: She is oriented to person, place, and time. She appears well-developed and well-nourished. No distress.  HENT:  Head: Normocephalic and atraumatic.  Eyes: EOM are normal. Pupils are equal, round, and reactive to light.  Neck: Normal range of motion. Neck supple.  Cardiovascular: Normal rate, regular rhythm and normal heart sounds.   Pulmonary/Chest: She has wheezes.  Patient is on oxygen. Diffuesly harsh with mild wheezing.  Musculoskeletal:  4 cm mass in R axilla is firm and tender with erythema. Large area that is mildly erythematous on the R axilla, R chest wall, and lateral R breast that is demarkated with a pen. Medial bruise to right forearm with erythema, induration, and firmness in dorsal aspect. Various bruising on other extremities. No peripheral edema.   Neurological: She is alert and oriented to person, place, and time.  Skin: There is erythema.  Psychiatric: She has a normal mood and affect. Her behavior is normal.    ED Course  Procedures (including critical care time)  Results for orders placed during the hospital encounter of 06/25/13  CBC WITH DIFFERENTIAL      Result Value Range  WBC 14.1 (*) 4.0 - 10.5 K/uL   RBC 3.07 (*) 3.87 - 5.11 MIL/uL   Hemoglobin 8.7 (*) 12.0 - 15.0 g/dL   HCT 52.8  (*) 41.3 - 46.0 %   MCV 88.3  78.0 - 100.0 fL   MCH 28.3  26.0 - 34.0 pg   MCHC 32.1  30.0 - 36.0 g/dL   RDW 24.4  01.0 - 27.2 %   Platelets 327  150 - 400 K/uL   Neutrophils Relative % 66  43 - 77 %   Neutro Abs 9.3 (*) 1.7 - 7.7 K/uL   Lymphocytes Relative 21  12 - 46 %   Lymphs Abs 3.0  0.7 - 4.0 K/uL   Monocytes Relative 8  3 - 12 %   Monocytes Absolute 1.1 (*) 0.1 - 1.0 K/uL   Eosinophils Relative 5  0 - 5 %   Eosinophils Absolute 0.6  0.0 - 0.7 K/uL   Basophils Relative 1  0 - 1 %   Basophils Absolute 0.1  0.0 - 0.1 K/uL  BASIC METABOLIC PANEL      Result Value Range   Sodium 135  135 - 145 mEq/L   Potassium 4.5  3.5 - 5.1 mEq/L   Chloride 95 (*) 96 - 112 mEq/L   CO2 27  19 - 32 mEq/L   Glucose, Bld 172 (*) 70 - 99 mg/dL   BUN 40 (*) 6 - 23 mg/dL   Creatinine, Ser 5.36 (*) 0.50 - 1.10 mg/dL   Calcium 9.3  8.4 - 64.4 mg/dL   GFR calc non Af Amer 29 (*) >90 mL/min   GFR calc Af Amer 34 (*) >90 mL/min   No results found.  DIAGNOSTIC STUDIES: Oxygen Saturation is 97% on [place], adequate by my interpretation.    COORDINATION OF CARE: 9:00 AM-Discussed mass under R axilla getting better and also my concern for the new infection that has developed on the R forearm. Will review charts and prior visits and F/U with patient.Treatment plan discussed with patient and patient agrees.  Labs Review Labs Reviewed  CBC WITH DIFFERENTIAL - Abnormal; Notable for the following:    WBC 14.1 (*)    RBC 3.07 (*)    Hemoglobin 8.7 (*)    HCT 27.1 (*)    Neutro Abs 9.3 (*)    Monocytes Absolute 1.1 (*)    All other components within normal limits  BASIC METABOLIC PANEL - Abnormal; Notable for the following:    Chloride 95 (*)    Glucose, Bld 172 (*)    BUN 40 (*)    Creatinine, Ser 1.65 (*)    GFR calc non Af Amer 29 (*)    GFR calc Af Amer 34 (*)    All other components within normal limits   Imaging Review No results found.  EKG Interpretation   None       MDM   1.  Cellulitis    Patient in for recheck after masses or colitis of right axilla and chest wall. By description from previous visits the sounds to be stable to improving., However patient has had the development of another cellulitis on her right forearm. Her antibiotics and given her nausea and she's been unable to keep much. Her creatinine is mildly elevated, as is her white count. At this point if he should benefit from inpatient treatment of the cellulitis. Will be admitted to internal medicine.  I personally performed the services described in this documentation, which was scribed  in my presence. The recorded information has been reviewed and is accurate.     Juliet Rude. Rubin Payor, MD 06/25/13 1007

## 2013-06-26 LAB — GLUCOSE, CAPILLARY
Glucose-Capillary: 191 mg/dL — ABNORMAL HIGH (ref 70–99)
Glucose-Capillary: 148 mg/dL — ABNORMAL HIGH (ref 70–99)
Glucose-Capillary: 159 mg/dL — ABNORMAL HIGH (ref 70–99)

## 2013-06-26 LAB — HEMOGLOBIN A1C: Hgb A1c MFr Bld: 6.5 % — ABNORMAL HIGH (ref <5.7)

## 2013-06-26 LAB — BASIC METABOLIC PANEL
BUN: 43 mg/dL — ABNORMAL HIGH (ref 6–23)
CO2: 30 mEq/L (ref 19–32)
Calcium: 9.1 mg/dL (ref 8.4–10.5)
GFR calc non Af Amer: 26 mL/min — ABNORMAL LOW (ref >90)
Glucose, Bld: 194 mg/dL — ABNORMAL HIGH (ref 70–99)
Sodium: 139 mEq/L (ref 135–145)

## 2013-06-26 LAB — CBC
MCH: 28.3 pg (ref 26.0–34.0)
MCHC: 31.6 g/dL (ref 30.0–36.0)
MCV: 89.6 fL (ref 78.0–100.0)
Platelets: 300 10*3/uL (ref 150–400)

## 2013-06-26 MED ORDER — ALBUTEROL SULFATE (5 MG/ML) 0.5% IN NEBU
2.5000 mg | INHALATION_SOLUTION | RESPIRATORY_TRACT | Status: DC | PRN
Start: 2013-06-26 — End: 2013-06-27
  Administered 2013-06-26: 2.5 mg via RESPIRATORY_TRACT

## 2013-06-26 MED ORDER — PANTOPRAZOLE SODIUM 40 MG PO TBEC
40.0000 mg | DELAYED_RELEASE_TABLET | Freq: Two times a day (BID) | ORAL | Status: DC
  Administered 2013-06-26 – 2013-06-27 (×2): 40 mg via ORAL
  Filled 2013-06-26 (×3): qty 1

## 2013-06-26 MED ORDER — CALCIUM CARBONATE ANTACID 500 MG PO CHEW
1.0000 | CHEWABLE_TABLET | Freq: Three times a day (TID) | ORAL | Status: DC
  Administered 2013-06-26 – 2013-06-27 (×3): 200 mg via ORAL
  Filled 2013-06-26 (×4): qty 1

## 2013-06-26 MED ORDER — LORAZEPAM 0.5 MG PO TABS
0.5000 mg | ORAL_TABLET | Freq: Once | ORAL | Status: AC
  Administered 2013-06-26: 0.5 mg via ORAL
  Filled 2013-06-26: qty 1

## 2013-06-26 MED ORDER — ALBUTEROL SULFATE (5 MG/ML) 0.5% IN NEBU
2.5000 mg | INHALATION_SOLUTION | Freq: Four times a day (QID) | RESPIRATORY_TRACT | Status: DC
  Administered 2013-06-26 – 2013-06-27 (×3): 2.5 mg via RESPIRATORY_TRACT
  Filled 2013-06-26 (×3): qty 0.5

## 2013-06-26 MED ORDER — ALBUTEROL SULFATE (5 MG/ML) 0.5% IN NEBU
2.5000 mg | INHALATION_SOLUTION | RESPIRATORY_TRACT | Status: DC | PRN
  Filled 2013-06-26: qty 0.5

## 2013-06-26 NOTE — Progress Notes (Signed)
[facility] PROGRESS NOTE  Ann Harrison AVW:[id number] DOB: [birth year] DOA: 06/25/2013 PCP: Arlina Robes, MD  Assessment/Plan: 1. Right axilla, breast cellulitis: Continues to improve. No evidence of complicating feature or abscess. 2. Right forearm cellulitis: Appears nearly resolved.  3. GERD, dysphagia: Plan as below. 4. Diabetes mellitus type 2: Stable. Continue Lantus, meal coverage, sliding scale insulin. 5. Chronic kidney disease, stage unknown: Suspected baseline. Monitor clinically. 6. Suspected anemia of chronic disease: Hemoglobin stable. Followup as an outpatient. 7. History of coronary artery disease, stroke, CHF type unknown: These issues appear stable. Stent placement was then more years ago. I doubt she will need any type of procedure but will hold Plavix until this is certain. Continue Lipitor and aspirin.   Continue current antibiotics, likely change to oral therapy in the morning  Make Protonix twice daily. Add TUMS. Has a history of esophageal dilatation in the past. Monitor clinically. May need to consider GI consultation inpatient or outpatient.  Pending studies:   Hemoglobin A1c  Code Status: full code DVT prophylaxis: heparin Family Communication: none present Disposition Plan: home, likely 12/14  Brendia Sacks, MD  [facility]  Pager [phone] If 7PM-7AM, please contact night-coverage at [email or web address], password [facility] 06/26/2013, 10:38 AM  LOS: 1 day   Summary: 78 year old woman who receives all her care in [place] seen in the emergency Department 12/10 for right axilla and breast cellulitis, placed on clindamycin. Imaging at that time was negative for abscess. She presented to the emergency department today for wound recheck. Although the area of the breast and the axilla and to improve, she appeared to have forearm cellulitis and was thus referred for admission.  Consultants:    Procedures:    Antibiotics:  Vancomycin  12/12 >>   HPI/Subjective: Right forearm much improved. Axilla remains tender. Complains of reflux, heartburn and some dysphagia.  Objective: Filed Vitals:   06/25/13 1045 06/25/13 1451 06/25/13 2039 06/26/13 0517  BP: 151/38 144/52 127/63 147/91  Pulse: 60 65 59 57  Temp:  98.1 F (36.7 C) 98.4 F (36.9 C) 97.9 F (36.6 C)  TempSrc:  Oral Oral Oral  Resp: 20 19 20 20   Height:      Weight:      SpO2: 100% 100% 99% 97%    Intake/Output Summary (Last 24 hours) at 06/26/13 1038 Last data filed at 06/26/13 0847  Gross per 24 hour  Intake   1310 ml  Output      2 ml  Net   1308 ml     Filed Weights   06/25/13 0847  Weight: 93.895 kg (207 lb)    Exam:   Afebrile, vital signs stable. Stable hypoxia.  General: Appears calm, comfortable. Sitting in chair.  Cardiovascular: Regular rate and rhythm. No murmur, rub gallop. No lower extremity edema.  Respiratory: Clear to auscultation bilaterally. No wheezes, rales or rhonchi. Normal respiratory effort.  Right forearm: Resolution of erythema. Still warm but nontender. No fluctuance.  Right axilla demonstrates decreased erythema, decreased induration, less tender to palpation. No fluctuance appreciated.  Right breast shows near resolution of cellulitis.  Data Reviewed:  Creatinine 1.80, somewhat higher from yesterday  Hemoglobin stable 8.4.  Leukocytosis has decreased 14.1 >> 11.6  Scheduled Meds: . amLODipine  10 mg Oral Daily  . aspirin EC  81 mg Oral Daily  . atorvastatin  20 mg Oral Daily  . calcium carbonate  1 tablet Oral TID WC  . furosemide  80 mg Oral Daily  . heparin  5,000 Units  Subcutaneous Q8H  . hydrALAZINE  50 mg Oral TID  . insulin aspart  0-5 Units Subcutaneous QHS  . insulin aspart  0-9 Units Subcutaneous TID WC  . insulin aspart  6 Units Subcutaneous TID WC  . insulin glargine  30 Units Subcutaneous QHS  . levothyroxine  50 mcg Oral QAC breakfast  . metoprolol tartrate  25 mg Oral Daily  .  neomycin-polymyxin b-dexamethasone  1 application Both Eyes BID  . pantoprazole  40 mg Oral BID AC  . sertraline  25 mg Oral Daily  . sodium chloride  3 mL Intravenous Q12H  . vancomycin  1,000 mg Intravenous Q24H   Continuous Infusions:   Principal Problem:   Cellulitis and abscess of upper arm and forearm Active Problems:   Cellulitis   Cellulitis of breast   DM type 2 (diabetes mellitus, type 2)   CKD (chronic kidney disease)   Cellulitis of forearm   Time spent 15 minutes

## 2013-06-27 LAB — BASIC METABOLIC PANEL
BUN: 48 mg/dL — ABNORMAL HIGH (ref 6–23)
CO2: 26 mEq/L (ref 19–32)
Calcium: 8.9 mg/dL (ref 8.4–10.5)
Chloride: 100 mEq/L (ref 96–112)
Creatinine, Ser: 1.81 mg/dL — ABNORMAL HIGH (ref 0.50–1.10)
GFR calc Af Amer: 30 mL/min — ABNORMAL LOW (ref >90)
Potassium: 5.3 mEq/L — ABNORMAL HIGH (ref 3.5–5.1)

## 2013-06-27 LAB — GLUCOSE, CAPILLARY: Glucose-Capillary: 156 mg/dL — ABNORMAL HIGH (ref 70–99)

## 2013-06-27 MED ORDER — LOPERAMIDE HCL 2 MG PO CAPS
2.0000 mg | ORAL_CAPSULE | Freq: Once | ORAL | Status: AC
  Administered 2013-06-27: 2 mg via ORAL
  Filled 2013-06-27: qty 1

## 2013-06-27 MED ORDER — DOXYCYCLINE HYCLATE 100 MG PO TABS
100.0000 mg | ORAL_TABLET | Freq: Two times a day (BID) | ORAL | Status: DC
  Administered 2013-06-27: 100 mg via ORAL
  Filled 2013-06-27: qty 1

## 2013-06-27 MED ORDER — SODIUM POLYSTYRENE SULFONATE 15 GM/60ML PO SUSP
15.0000 g | Freq: Once | ORAL | Status: AC
  Administered 2013-06-27: 15 g via ORAL
  Filled 2013-06-27: qty 60

## 2013-06-27 MED ORDER — CEPHALEXIN 500 MG PO CAPS: 500.0000 mg | ORAL_CAPSULE | Freq: Two times a day (BID) | ORAL | Status: DC

## 2013-06-27 MED ORDER — CEPHALEXIN 500 MG PO CAPS: 500.0000 mg | ORAL_CAPSULE | Freq: Two times a day (BID) | ORAL | Status: AC

## 2013-06-27 MED ORDER — CEPHALEXIN 500 MG PO CAPS
500.0000 mg | ORAL_CAPSULE | Freq: Two times a day (BID) | ORAL | Status: DC
  Administered 2013-06-27: 500 mg via ORAL
  Filled 2013-06-27: qty 1

## 2013-06-27 MED ORDER — DOXYCYCLINE HYCLATE 100 MG PO TABS: 100.0000 mg | ORAL_TABLET | Freq: Two times a day (BID) | ORAL | Status: AC

## 2013-06-27 NOTE — Progress Notes (Signed)
[facility] PROGRESS NOTE  Ann Harrison ZOX:[id number] DOB: Apr 16, 1935 DOA: 06/25/2013 PCP: Arlina Robes, MD  Assessment/Plan: 1. Right axilla, breast cellulitis: Nearly resolved. 2. Right forearm cellulitis: Appears resolved 3. Mild hyperkalemia 4. GERD, dysphagia: Stable. Followup as an outpatient. 5. Diabetes mellitus type 2: Stable. Continue Lantus, meal coverage, sliding scale insulin. 6. Chronic kidney disease, stage unknown: Suspected baseline. Stable. Followup as an outpatient. 7. Suspected anemia of chronic disease: Hemoglobin stable. Followup as an outpatient. 8. History of coronary artery disease, stroke, CHF type unknown: These issues appear stable. Stent placement was then more years ago.  Continue Lipitor and aspirin.   Kayexalate x1  Change to oral antibiotics tetracycline Keflex  Discharge home  Followup GERD as an outpatient  Followup chronic kidney disease as an outpatient, suggest repeat basic metabolic panel in one week  Followup anemia suspected to be from chronic disease  Resume Plavix  Brendia Sacks, MD  [facility]  Pager [phone] If 7PM-7AM, please contact night-coverage at [email or web address], password [facility] 06/27/2013, 11:18 AM  LOS: 2 days   Summary: 78 year old woman who receives all her care in [place] seen in the emergency Department 12/10 for right axilla and breast cellulitis, placed on clindamycin. Imaging at that time was negative for abscess. She presented to the emergency department today for wound recheck. Although the area of the breast and the axilla and to improve, she appeared to have forearm cellulitis and was thus referred for admission.  Consultants:    Procedures:    Antibiotics:  Vancomycin 12/12 >> 12/13  Doxycycline 12/13 >> 12/18  Keflex 12/13 >> 12/18  HPI/Subjective: Feels much better. Wants to go home. Infection rapidly improving.  Objective: Filed Vitals:   06/26/13 2032 06/26/13 2347  06/27/13 0454 06/27/13 0713  BP: 128/56  130/60   Pulse: 58  62   Temp: 98.6 F (37 C)  98 F (36.7 C)   TempSrc: Oral     Resp: 20  20   Height:      Weight:      SpO2: 96% 99% 98% 98%    Intake/Output Summary (Last 24 hours) at 06/27/13 1118 Last data filed at 06/27/13 0800  Gross per 24 hour  Intake    440 ml  Output      3 ml  Net    437 ml     Filed Weights   06/25/13 0847  Weight: 93.895 kg (207 lb)    Exam:   Afebrile, vital signs stable. Stable hypoxia.  General: Appears calm and comfortable. Speech fluent and clear. Well-appearing.  Cardiovascular: Regular rate and rhythm. No murmur, rub or gallop.  Respiratory: Clear to auscultation bilaterally. No wheezes, rales or rhonchi. Normal respiratory effort.  Right forearm: Erythema has resolved, swelling has resolved. No induration or fluctuance. Nontender to palpation.  Right breast: Erythema completely resolved  Right axilla: Area of induration is markedly decreased in size, now nontender. No fluctuance.  Data Reviewed:  Potassium 5.3  BUN/creatinine stable  Scheduled Meds: . albuterol  2.5 mg Nebulization QID  . amLODipine  10 mg Oral Daily  . aspirin EC  81 mg Oral Daily  . atorvastatin  20 mg Oral Daily  . calcium carbonate  1 tablet Oral TID WC  . furosemide  80 mg Oral Daily  . heparin  5,000 Units Subcutaneous Q8H  . hydrALAZINE  50 mg Oral TID  . insulin aspart  0-5 Units Subcutaneous QHS  . insulin aspart  0-9 Units Subcutaneous TID WC  .  insulin aspart  6 Units Subcutaneous TID WC  . insulin glargine  30 Units Subcutaneous QHS  . levothyroxine  50 mcg Oral QAC breakfast  . metoprolol tartrate  25 mg Oral Daily  . neomycin-polymyxin b-dexamethasone  1 application Both Eyes BID  . pantoprazole  40 mg Oral BID AC  . sertraline  25 mg Oral Daily  . sodium chloride  3 mL Intravenous Q12H  . vancomycin  1,000 mg Intravenous Q24H   Continuous Infusions:   Principal Problem:    Cellulitis and abscess of upper arm and forearm Active Problems:   Cellulitis   Cellulitis of breast   DM type 2 (diabetes mellitus, type 2)   CKD (chronic kidney disease)   Cellulitis of forearm

## 2013-06-27 NOTE — Progress Notes (Signed)
Pt discharged home with family Pt and family refused to go home to get pt O2 Pt and family stated that the pt does not use O2 all day Pt states she uses it sometimes during the day and always at night. I educated the family on O2 use and pt went home by own choice without O2

## 2013-06-27 NOTE — Discharge Summary (Signed)
Physician Discharge Summary  Ann Herald ZOX:[id number] DOB: 02/15/1935 DOA: 06/25/2013  PCP: Arlina Robes, MD  Admit date: 06/25/2013 Discharge date: 06/27/2013  Recommendations for Outpatient Follow-up:  1. Resolution of right axilla, breast, forearm cellulitis 2. Chronic kidney disease, mild hyperkalemia, consider repeat basic metabolic panel in one week 3. GERD, dysphagia 4. Suspected anemia of chronic disease   Follow-up Information   Follow up with Arlina Robes, MD. Schedule an appointment as soon as possible for a visit in 1 week.   Specialty:  Family Medicine     Discharge Diagnoses:  1. Right axilla, breast cellulitis 2. Right forearm cellulitis 3. Mild hyperkalemia 4. GERD, dysphagia 5. Diabetes mellitus type 2 6. Chronic kidney disease stage unknown 7. Suspected anemia of chronic disease  Discharge Condition: Improved Disposition: Home  Diet recommendation: Heart healthy diabetic diet  Filed Weights   06/25/13 0847  Weight: 93.895 kg (207 lb)    History of present illness:  78 year old woman who receives all her care in [place] seen in the emergency Department 12/10 for right axilla and breast cellulitis, placed on clindamycin. Imaging at that time was negative for abscess. She presented to the emergency department today for wound recheck. Although the area of the breast and the axilla and to improve, she appeared to have forearm cellulitis and was thus referred for admission.  Hospital Course:  Ms. Harrison improved rapidly on IV vancomycin with near complete resolution of cellulitis of the right forearm, breast and axilla. No complicating features are noted in her hospitalization was uncomplicated. Because forearm cellulitis developed on clindamycin, she will be discharged home on alternative antibiotics. Individual issues as below.  1. Right axilla, breast cellulitis: Nearly resolved. Treat with vancomycin. 2. Right forearm cellulitis:  Appears resolved 3. Mild hyperkalemia 4. GERD, dysphagia: Stable. Followup as an outpatient. 5. Diabetes mellitus type 2: Stable. Continue Lantus, meal coverage, sliding scale insulin. 6. Chronic kidney disease, stage unknown: Suspected baseline. Stable. Followup as an outpatient. 7. Suspected anemia of chronic disease: Hemoglobin stable. Followup as an outpatient. 8. History of coronary artery disease, stroke, CHF type unknown: These issues appear stable. Stent placement was then more years ago. Continue Lipitor and aspirin.  Consultants: none Procedures: none Antibiotics:  Vancomycin 12/12 >> 12/13  Doxycycline 12/13 >> 12/18  Keflex 12/13 >> 12/18  Discharge Instructions  Discharge Orders   Future Orders Complete By Expires   Activity as tolerated - No restrictions  As directed    Diet - low sodium heart healthy  As directed    Diet Carb Modified  As directed    Discharge instructions  As directed    Comments:     Be sure to finish antibiotics. Call your physician or seek immediate medical attention for worsening of infection, fever or worsening of condition. Followup with your primary care physician within one week. Low potassium diet.       Medication List    STOP taking these medications       clindamycin 300 MG capsule  Commonly known as:  CLEOCIN      TAKE these medications       acetaminophen 500 MG tablet  Commonly known as:  TYLENOL  Take 1,000 mg by mouth every 8 (eight) hours as needed for mild pain or moderate pain.     amLODipine 10 MG tablet  Commonly known as:  NORVASC  1 tablet daily.     aspirin EC 81 MG tablet  Take 81 mg by mouth daily.  atorvastatin 20 MG tablet  Commonly known as:  LIPITOR  1 tablet daily.     cephALEXin 500 MG capsule  Commonly known as:  KEFLEX  Take 1 capsule (500 mg total) by mouth every 12 (twelve) hours.     Cholecalciferol 5000 UNITS capsule  Take 5,000 Units by mouth daily.     clopidogrel 75 MG tablet   Commonly known as:  PLAVIX  Take 1 tablet by mouth daily.     cyclobenzaprine 5 MG tablet  Commonly known as:  FLEXERIL  Take 5 mg by mouth 3 (three) times daily as needed for muscle spasms.     doxycycline 100 MG tablet  Commonly known as:  VIBRA-TABS  Take 1 tablet (100 mg total) by mouth every 12 (twelve) hours.     furosemide 80 MG tablet  Commonly known as:  LASIX  Take 1 tablet by mouth daily.     hydrALAZINE 50 MG tablet  Commonly known as:  APRESOLINE  Take 1 tablet by mouth 3 (three) times daily.     HYDROcodone-acetaminophen 10-325 MG per tablet  Commonly known as:  NORCO  Take 1-3 tablets by mouth daily.     insulin glargine 100 UNIT/ML injection  Commonly known as:  LANTUS  Inject 30 Units into the skin at bedtime.     insulin lispro 100 UNIT/ML injection  Commonly known as:  HUMALOG  Inject 6 Units into the skin 3 (three) times daily before meals.     levothyroxine 50 MCG tablet  Commonly known as:  SYNTHROID, LEVOTHROID  Take 1 tablet by mouth daily.     metoprolol tartrate 25 MG tablet  Commonly known as:  LOPRESSOR  Take 1 tablet by mouth daily.     neomycin-polymyxin b-dexamethasone 3.5-10000-0.1 Oint  Commonly known as:  MAXITROL  Place 1 application into both eyes 2 (two) times daily.     omeprazole 20 MG capsule  Commonly known as:  PRILOSEC  Take 20 mg by mouth daily.     sertraline 25 MG tablet  Commonly known as:  ZOLOFT  Take 1 tablet by mouth daily.     Vitamin D (Ergocalciferol) 50000 UNITS Caps capsule  Commonly known as:  DRISDOL  Take 50,000 Units by mouth every 7 (seven) days.     vitamin E 400 UNIT capsule  Take 400 Units by mouth daily.       Allergies  Allergen Reactions  . Oxycodone Itching  . Feldene [Piroxicam] Rash  . Tape Rash    The results of significant diagnostics from this hospitalization (including imaging, microbiology, ancillary and laboratory) are listed below for reference.     Labs: Basic  Metabolic Panel:  Recent Labs Lab 06/23/13 1220 06/25/13 0907 06/26/13 0518 06/27/13 0613  NA 135 135 139 137  K 4.8 4.5 4.7 5.3*  CL 96 95* 100 100  CO2 25 27 30 26   GLUCOSE 132* 172* 194* 181*  BUN 42* 40* 43* 48*  CREATININE 1.42* 1.65* 1.80* 1.81*  CALCIUM 9.2 9.3 9.1 8.9   CBC:  Recent Labs Lab 06/23/13 1220 06/25/13 0907 06/26/13 0518  WBC 15.5* 14.1* 11.6*  NEUTROABS 11.6* 9.3*  --   HGB 8.8* 8.7* 8.4*  HCT 27.4* 27.1* 26.6*  MCV 89.5 88.3 89.6  PLT 333 327 300   CBG:  Recent Labs Lab 06/25/13 2038 06/26/13 0727 06/26/13 1116 06/26/13 1619 06/26/13 2039  GLUCAP 175* 191* 148* 101* 159*    Principal Problem:   Cellulitis and abscess  of upper arm and forearm Active Problems:   Cellulitis   Cellulitis of breast   DM type 2 (diabetes mellitus, type 2)   CKD (chronic kidney disease)   Cellulitis of forearm   Time coordinating discharge: 25 minutes  Signed:  Brendia Sacks, MD [facility] 06/27/2013, 11:46 AM

## 2013-06-27 NOTE — Plan of Care (Signed)
Problem: Discharge Progression Outcomes Goal: Barriers To Progression Addressed/Resolved Outcome: Adequate for Discharge reddness to rt axillary area is subsiding  Goal: Pain controlled with appropriate interventions Outcome: Completed/Met Date Met:  06/27/13 Denies pain

## 2014-09-02 ENCOUNTER — Ambulatory Visit (HOSPITAL_COMMUNITY)
Admission: AD | Admit: 2014-09-02 | Discharge: 2014-09-02 | Disposition: A | Discharge status: Home / Self Care | Payer: Medicare Other | Source: Other Acute Inpatient Hospital | Attending: Internal Medicine | Admitting: Internal Medicine

## 2014-09-02 ENCOUNTER — Inpatient Hospital Stay
Admission: AD | Admit: 2014-09-02 | Discharge: 2014-10-14 | Disposition: E | Payer: Self-pay | Source: Ambulatory Visit | Attending: Internal Medicine | Admitting: Internal Medicine

## 2014-09-02 DIAGNOSIS — J9620 Acute and chronic respiratory failure, unspecified whether with hypoxia or hypercapnia: Secondary | ICD-10-CM

## 2014-09-02 DIAGNOSIS — I509 Heart failure, unspecified: Secondary | ICD-10-CM

## 2014-09-02 DIAGNOSIS — Z719 Counseling, unspecified: Secondary | ICD-10-CM

## 2014-09-02 DIAGNOSIS — Z452 Encounter for adjustment and management of vascular access device: Secondary | ICD-10-CM

## 2014-09-02 DIAGNOSIS — J90 Pleural effusion, not elsewhere classified: Secondary | ICD-10-CM

## 2014-09-02 DIAGNOSIS — J9690 Respiratory failure, unspecified, unspecified whether with hypoxia or hypercapnia: Secondary | ICD-10-CM

## 2014-09-02 DIAGNOSIS — J449 Chronic obstructive pulmonary disease, unspecified: Secondary | ICD-10-CM

## 2014-09-02 DIAGNOSIS — Z0189 Encounter for other specified special examinations: Secondary | ICD-10-CM

## 2014-09-03 ENCOUNTER — Other Ambulatory Visit (HOSPITAL_COMMUNITY): Payer: Self-pay

## 2014-09-03 ENCOUNTER — Other Ambulatory Visit: Payer: Self-pay

## 2014-09-03 LAB — CBC WITH DIFFERENTIAL/PLATELET
Basophils Absolute: 0 10*3/uL (ref 0.0–0.1)
Basophils Relative: 0 % (ref 0–1)
EOS ABS: 0 10*3/uL (ref 0.0–0.7)
EOS PCT: 0 % (ref 0–5)
HCT: 27.3 % — ABNORMAL LOW (ref 36.0–46.0)
Hemoglobin: 8.7 g/dL — ABNORMAL LOW (ref 12.0–15.0)
Lymphocytes Relative: 30 % (ref 12–46)
Lymphs Abs: 5.7 10*3/uL — ABNORMAL HIGH (ref 0.7–4.0)
MCH: 28.4 pg (ref 26.0–34.0)
MCHC: 31.9 g/dL (ref 30.0–36.0)
MCV: 89.2 fL (ref 78.0–100.0)
MONO ABS: 1 10*3/uL (ref 0.1–1.0)
MONOS PCT: 5 % (ref 3–12)
NEUTROS ABS: 12.4 10*3/uL — AB (ref 1.7–7.7)
NEUTROS PCT: 65 % (ref 43–77)
PLATELETS: 268 10*3/uL (ref 150–400)
RBC: 3.06 MIL/uL — AB (ref 3.87–5.11)
RDW: 15.8 % — ABNORMAL HIGH (ref 11.5–15.5)
WBC: 19.1 10*3/uL — AB (ref 4.0–10.5)

## 2014-09-03 LAB — COMPREHENSIVE METABOLIC PANEL
ALBUMIN: 3.4 g/dL — AB (ref 3.5–5.2)
ALT: 31 U/L (ref 0–35)
AST: 20 U/L (ref 0–37)
Alkaline Phosphatase: 39 U/L (ref 39–117)
Anion gap: 6 (ref 5–15)
BUN: 66 mg/dL — AB (ref 6–23)
CO2: 28 mmol/L (ref 19–32)
Calcium: 8.6 mg/dL (ref 8.4–10.5)
Chloride: 102 mmol/L (ref 96–112)
Creatinine, Ser: 1.48 mg/dL — ABNORMAL HIGH (ref 0.50–1.10)
GFR calc Af Amer: 38 mL/min — ABNORMAL LOW (ref >90)
GFR calc non Af Amer: 33 mL/min — ABNORMAL LOW (ref >90)
GLUCOSE: 136 mg/dL — AB (ref 70–99)
Potassium: 5.2 mmol/L — ABNORMAL HIGH (ref 3.5–5.1)
Sodium: 136 mmol/L (ref 135–145)
Total Bilirubin: 0.9 mg/dL (ref 0.3–1.2)
Total Protein: 5.6 g/dL — ABNORMAL LOW (ref 6.0–8.3)

## 2014-09-03 LAB — BLOOD GAS, ARTERIAL
Acid-Base Excess: 1.7 mmol/L (ref 0.0–2.0)
Bicarbonate: 27 mEq/L — ABNORMAL HIGH (ref 20.0–24.0)
Delivery systems: POSITIVE
Expiratory PAP: 8
FIO2: 0.4 %
INSPIRATORY PAP: 18
MODE: POSITIVE
O2 SAT: 95.2 %
PATIENT TEMPERATURE: 98.6
PCO2 ART: 52.3 mmHg — AB (ref 35.0–45.0)
RATE: 24 resp/min
TCO2: 28.6 mmol/L (ref 0–100)
pH, Arterial: 7.332 — ABNORMAL LOW (ref 7.350–7.450)
pO2, Arterial: 80.9 mmHg (ref 80.0–100.0)

## 2014-09-03 LAB — VANCOMYCIN, RANDOM: VANCOMYCIN RM: 10.1 ug/mL

## 2014-09-03 LAB — VITAMIN B12: Vitamin B-12: 410 pg/mL (ref 211–911)

## 2014-09-03 LAB — PROCALCITONIN: Procalcitonin: <0.1 ng/mL

## 2014-09-03 LAB — PHOSPHORUS: PHOSPHORUS: 4.5 mg/dL (ref 2.3–4.6)

## 2014-09-03 LAB — T4, FREE: Free T4: 1.14 ng/dL (ref 0.80–1.80)

## 2014-09-03 LAB — TSH: TSH: 1.315 u[IU]/mL (ref 0.350–4.500)

## 2014-09-03 LAB — MAGNESIUM: MAGNESIUM: 2.7 mg/dL — AB (ref 1.5–2.5)

## 2014-09-05 ENCOUNTER — Other Ambulatory Visit (HOSPITAL_COMMUNITY): Payer: Self-pay

## 2014-09-05 DIAGNOSIS — J9601 Acute respiratory failure with hypoxia: Secondary | ICD-10-CM

## 2014-09-05 DIAGNOSIS — J441 Chronic obstructive pulmonary disease with (acute) exacerbation: Secondary | ICD-10-CM

## 2014-09-05 DIAGNOSIS — J9811 Atelectasis: Secondary | ICD-10-CM

## 2014-09-05 LAB — BLOOD GAS, ARTERIAL
ACID-BASE DEFICIT: 1.2 mmol/L (ref 0.0–2.0)
Bicarbonate: 25.3 mEq/L — ABNORMAL HIGH (ref 20.0–24.0)
DELIVERY SYSTEMS: POSITIVE
EXPIRATORY PAP: 8
FIO2: 0.4 %
Inspiratory PAP: 18
O2 Saturation: 90.9 %
PH ART: 7.242 — AB (ref 7.350–7.450)
Patient temperature: 98.6
TCO2: 27.1 mmol/L (ref 0–100)
pCO2 arterial: 60.8 mmHg (ref 35.0–45.0)
pO2, Arterial: 67.2 mmHg — ABNORMAL LOW (ref 80.0–100.0)

## 2014-09-05 LAB — COMPREHENSIVE METABOLIC PANEL
ALBUMIN: 3.3 g/dL — AB (ref 3.5–5.2)
ALT: 22 U/L (ref 0–35)
AST: 15 U/L (ref 0–37)
Alkaline Phosphatase: 35 U/L — ABNORMAL LOW (ref 39–117)
Anion gap: 5 (ref 5–15)
BILIRUBIN TOTAL: 0.6 mg/dL (ref 0.3–1.2)
BUN: 95 mg/dL — ABNORMAL HIGH (ref 6–23)
CALCIUM: 8.4 mg/dL (ref 8.4–10.5)
CO2: 28 mmol/L (ref 19–32)
CREATININE: 2.1 mg/dL — AB (ref 0.50–1.10)
Chloride: 103 mmol/L (ref 96–112)
GFR calc Af Amer: 25 mL/min — ABNORMAL LOW (ref >90)
GFR calc non Af Amer: 22 mL/min — ABNORMAL LOW (ref >90)
GLUCOSE: 149 mg/dL — AB (ref 70–99)
POTASSIUM: 5.3 mmol/L — AB (ref 3.5–5.1)
Sodium: 136 mmol/L (ref 135–145)
TOTAL PROTEIN: 5.6 g/dL — AB (ref 6.0–8.3)

## 2014-09-05 LAB — PHOSPHORUS: PHOSPHORUS: 6.5 mg/dL — AB (ref 2.3–4.6)

## 2014-09-05 LAB — FOLATE RBC
Folate, Hemolysate: >620 ng/mL
Hematocrit: 27.3 % — ABNORMAL LOW (ref 34.0–46.6)

## 2014-09-05 LAB — MAGNESIUM: MAGNESIUM: 2.9 mg/dL — AB (ref 1.5–2.5)

## 2014-09-05 LAB — TRIGLYCERIDES: Triglycerides: 101 mg/dL (ref <150)

## 2014-09-05 NOTE — Consult Note (Signed)
Name: Ann Harrison MRN: [id number] DOB: Dec 04, 1934    ADMISSION DATE:  10/01/2014 CONSULTATION DATE:  2.22  REFERRING MD :  Hijazi   CHIEF COMPLAINT:  Respiratory failure  BRIEF PATIENT DESCRIPTION:  This is a 80 year old female with past medical history of diastolic dysfunction, hypertension, COPD, and chronic respiratory failure on 3 L. [facility] outside hospital after being treated for acute on chronic hypercarbic respiratory failure in the setting of acute exacerbation of COPD, with HCAP, and probable parapneumonic effusion. She was treated in the usual fashion which included empiric antibiotics, scheduled bronchodilators, systemic steroids, and noninvasive positive pressure ventilation. She also reportedly had a thoracentesis, unfortunately these results were not present. [facility], as she was unable to wean from noninvasive ventilation at the outside hospital. Pulmonary critical care has been asked to assess and assist with these efforts.  SIGNIFICANT EVENTS    STUDIES:     HISTORY OF PRESENT ILLNESS:   See above  PAST MEDICAL HISTORY :   has a past medical history of Hypertension; Diabetes mellitus without complication; CHF (congestive heart failure); Stroke; COPD (chronic obstructive pulmonary disease); Renal disorder; Arthritis; and MI (myocardial infarction).  has past surgical history that includes Back surgery; Coronary artery bypass graft; Abdominal hysterectomy; and Carpal tunnel release. Prior to Admission medications   Medication Sig Start Date End Date Taking? Authorizing Provider  acetaminophen (TYLENOL) 500 MG tablet Take 1,000 mg by mouth every 8 (eight) hours as needed for mild pain or moderate pain.    Historical Provider, MD  amLODipine (NORVASC) 10 MG tablet 1 tablet daily. 06/04/13   Historical Provider, MD  aspirin EC 81 MG tablet Take 81 mg by mouth  daily.    Historical Provider, MD  atorvastatin (LIPITOR) 20 MG tablet 1 tablet daily. 06/04/13   Historical Provider, MD  cephALEXin (KEFLEX) 500 MG capsule Take 1 capsule (500 mg total) by mouth every 12 (twelve) hours. 06/27/13   Standley Brooking, MD  Cholecalciferol 5000 UNITS capsule Take 5,000 Units by mouth daily.    Historical Provider, MD  clopidogrel (PLAVIX) 75 MG tablet Take 1 tablet by mouth daily. 06/16/13   Historical Provider, MD  cyclobenzaprine (FLEXERIL) 5 MG tablet Take 5 mg by mouth 3 (three) times daily as needed for muscle spasms.     Historical Provider, MD  doxycycline (VIBRA-TABS) 100 MG tablet Take 1 tablet (100 mg total) by mouth every 12 (twelve) hours. 06/27/13   Standley Brooking, MD  furosemide (LASIX) 80 MG tablet Take 1 tablet by mouth daily. 05/03/13   Historical Provider, MD  hydrALAZINE (APRESOLINE) 50 MG tablet Take 1 tablet by mouth 3 (three) times daily. 06/04/13   Historical Provider, MD  HYDROcodone-acetaminophen (NORCO) 10-325 MG per tablet Take 1-3 tablets by mouth daily. 06/04/13   Historical Provider, MD  insulin glargine (LANTUS) 100 UNIT/ML injection Inject 30 Units into the skin at bedtime.    Historical Provider, MD  insulin lispro (HUMALOG) 100 UNIT/ML injection Inject 6 Units into the skin 3 (three) times daily before meals.    Historical Provider, MD  levothyroxine (SYNTHROID, LEVOTHROID) 50 MCG tablet Take 1 tablet by mouth daily. 06/04/13   Historical Provider, MD  metoprolol tartrate (LOPRESSOR) 25 MG tablet Take 1 tablet by mouth daily. 06/04/13   Historical Provider, MD  neomycin-polymyxin b-dexamethasone (MAXITROL) 3.5-10000-0.1 OINT Place 1 application into both eyes 2 (two) times daily. 05/25/13  Historical Provider, MD  omeprazole (PRILOSEC) 20 MG capsule Take 20 mg by mouth daily.    Historical Provider, MD  sertraline (ZOLOFT) 25 MG tablet Take 1 tablet by mouth daily. 06/04/13   Historical Provider, MD  Vitamin D, Ergocalciferol,  (DRISDOL) 50000 UNITS CAPS capsule Take 50,000 Units by mouth every 7 (seven) days.    Historical Provider, MD  vitamin E 400 UNIT capsule Take 400 Units by mouth daily.    Historical Provider, MD   Allergies  Allergen Reactions  . Oxycodone Itching  . Feldene [Piroxicam] Rash  . Tape Rash    FAMILY HISTORY:  family history includes Cancer in her sister; Heart attack in her father. SOCIAL HISTORY:  reports that she has quit smoking. She does not have any smokeless tobacco history on file. She reports that she does not drink alcohol or use illicit drugs.  REVIEW OF SYSTEMS:   unable  SUBJECTIVE:  Labor breathing on noninvasive support VITAL SIGNS: reviewed  PHYSICAL EXAMINATION: General:  Chronically ill-appearing white female currently on noninvasive positive pressure ventilation Neuro:  Awake, alert, no focal deficits, however generalized weakness HEENT:  Neck is large, BiPAP mask in place, no clear jugular venous distention Cardiovascular:  Regular rate and rhythm Lungs:  Very diminished on the right specifically, has crackles in the left. She has prolonged expiratory wheezes throughout, right greater than left. She does demonstrate accessory muscle use in spite of noninvasive positive pressure support Abdomen:  Obese, no organomegaly, positive bowel sounds Musculoskeletal:  Generalized weakness Skin:  Dry, scattered area of ecchymosis, no significant edema   Recent Labs Lab 09/03/14 0829  NA 136  K 5.2*  CL 102  CO2 28  BUN 66*  CREATININE 1.48*  GLUCOSE 136*    Recent Labs Lab 09/03/14 0829  HGB 8.7*  HCT 27.3*  WBC 19.1*  PLT 268   No results found. Chest x-ray review 2/20: Probable pulmonary edema, right lower lobe atelectasis/collapse, probable loculated right effusion.  ASSESSMENT / PLAN: Acute on chronic hypercarbic respiratory failure Healthcare associated pneumonia Loculated right pleural effusion Right lower lobe atelectasis/Collapse Acute  exacerbation of chronic obstructive pulmonary disease Diastolic heart dysfunction Hyperkalemia Acute kidney injury Anemia of critical illness Diabetes Deconditioning  Discussion This is a very debilitated 80 year old female who presents with acute on chronic respiratory failure, and inability to wean from noninvasive positive pressure ventilation in the setting of healthcare associated pneumonia, acute exacerbation of chronic obstructive pulmonary disease, and right lower lobe atelectasis/collapse. She also appears to have a loculated right effusion. She's been treated appropriately with empiric antibiotics, scheduled bronchodilators, diuresis, and systemic steroids. She is even had reportedly a right thoracentesis, unfortunately these results are not present. In spite of all these therapies she remains on non invasive positive pressure ventilation. I am concerned that she may require intubation, if she desires to continue therapy. The primary services discussed this with the family, who have indicated that this is not a route she would like to go, However she remains a full code.  Recommendation We will repeat an arterial blood gas, and chest x-ray Continue current antibiotics Continue scheduled systemic steroids Continue scheduled bronchodilators We will need to further discuss goals of care with the family. Specifically intubation, understanding high risk of prolonged ventilation, possibility of tracheostomy, and worsening deconditioning. If intubated, might benefit from bronchoscopy in order to recruit right lower lobe. Interventional radiology has been consult to evaluate the right pleural effusion. We will follow that fluid analysis.  Simonne MartinetPeter E Babcock  ACNP-BC [facility] Pulmonary/Critical Care Pager # [phone] OR # [phone] if no answer  Attending:  I have seen and examined the patient with nurse practitioner/resident and agree with the note above.   On my exam she is tachypneic on  BIPAP, lung exam with mild wheezing, poor airmovement, diminished breath sounds in bases  Chart reviewed from [facility] as well as report from CT chest which showed RUL and RML GGO consistent with pneumonia as well as right lower lobe narrowing.  She currently has right lower lobe collapse on her most recent chest x-rays which is likely due to mucous plugging. She is some wheezing, but I believe that the majority of her problem is chronic COPD acutely compromised by right lower lobe collapse. I explained to her today that intubation with suctioning and possibly a bronchoscopy would be the optimal way to treat this, however she is not sure she wants to go on the ventilator at this point. I also explained to her that I cannot guarantee that she would be able to be extubated considering her multiple severe comorbid illnesses.  At this point, we will continue with aggressive supportive care including Mucomyst, and chest PT. We will continue the ongoing conversation regarding CODE STATUS with her family. I tried to call her son Cecelia Byars this morning but he did not answer the phone. Apparently there is a planned conversation tomorrow morning. In the meantime, we will continue aggressive measures with BiPAP and intubate if needed.  Independent critical care time by me 45 minutes  Heber [place], MD [place] [facility] Pager: [phone] Cell: [phone] If no response, call [phone]    09/05/2014, 8:57 AM

## 2014-09-06 LAB — CBC
HCT: 25.6 % — ABNORMAL LOW (ref 36.0–46.0)
HEMOGLOBIN: 8.1 g/dL — AB (ref 12.0–15.0)
MCH: 28.5 pg (ref 26.0–34.0)
MCHC: 31.6 g/dL (ref 30.0–36.0)
MCV: 90.1 fL (ref 78.0–100.0)
Platelets: 211 10*3/uL (ref 150–400)
RBC: 2.84 MIL/uL — ABNORMAL LOW (ref 3.87–5.11)
RDW: 15.8 % — ABNORMAL HIGH (ref 11.5–15.5)
WBC: 16.7 10*3/uL — ABNORMAL HIGH (ref 4.0–10.5)

## 2014-09-06 LAB — BASIC METABOLIC PANEL
Anion gap: 7 (ref 5–15)
BUN: 103 mg/dL — ABNORMAL HIGH (ref 6–23)
CALCIUM: 8.1 mg/dL — AB (ref 8.4–10.5)
CO2: 24 mmol/L (ref 19–32)
CREATININE: 2.2 mg/dL — AB (ref 0.50–1.10)
Chloride: 103 mmol/L (ref 96–112)
GFR calc Af Amer: 24 mL/min — ABNORMAL LOW (ref >90)
GFR calc non Af Amer: 20 mL/min — ABNORMAL LOW (ref >90)
GLUCOSE: 249 mg/dL — AB (ref 70–99)
Potassium: 5.8 mmol/L — ABNORMAL HIGH (ref 3.5–5.1)
Sodium: 134 mmol/L — ABNORMAL LOW (ref 135–145)

## 2014-09-07 ENCOUNTER — Other Ambulatory Visit (HOSPITAL_COMMUNITY): Payer: Self-pay

## 2014-09-07 DIAGNOSIS — J9809 Other diseases of bronchus, not elsewhere classified: Secondary | ICD-10-CM

## 2014-09-07 DIAGNOSIS — J449 Chronic obstructive pulmonary disease, unspecified: Secondary | ICD-10-CM

## 2014-09-07 LAB — BLOOD GAS, ARTERIAL
Acid-base deficit: 3.6 mmol/L — ABNORMAL HIGH (ref 0.0–2.0)
Bicarbonate: 22.9 mEq/L (ref 20.0–24.0)
FIO2: 0.5 %
O2 Saturation: 93.8 %
PATIENT TEMPERATURE: 98.6
PEEP/CPAP: 10 cmH2O
PIP: 35 cmH2O
RATE: 20 resp/min
TCO2: 24.7 mmol/L (ref 0–100)
pCO2 arterial: 57.1 mmHg (ref 35.0–45.0)
pH, Arterial: 7.228 — ABNORMAL LOW (ref 7.350–7.450)
pO2, Arterial: 74.3 mmHg — ABNORMAL LOW (ref 80.0–100.0)

## 2014-09-07 LAB — BASIC METABOLIC PANEL
ANION GAP: 13 (ref 5–15)
BUN: 123 mg/dL — ABNORMAL HIGH (ref 6–23)
CHLORIDE: 98 mmol/L (ref 96–112)
CO2: 27 mmol/L (ref 19–32)
Calcium: 8.1 mg/dL — ABNORMAL LOW (ref 8.4–10.5)
Creatinine, Ser: 3.09 mg/dL — ABNORMAL HIGH (ref 0.50–1.10)
GFR calc non Af Amer: 14 mL/min — ABNORMAL LOW (ref >90)
GFR, EST AFRICAN AMERICAN: 16 mL/min — AB (ref >90)
Glucose, Bld: 389 mg/dL — ABNORMAL HIGH (ref 70–99)
POTASSIUM: 5.3 mmol/L — AB (ref 3.5–5.1)
Sodium: 138 mmol/L (ref 135–145)

## 2014-09-07 NOTE — Procedures (Signed)
Intubation Procedure Note Ann PerchesMildred Harrison [id number] 11/01/1934  Procedure: Intubation Indications: Respiratory insufficiency  Procedure Details Consent: Risks of procedure as well as the alternatives and risks of each were explained to the (patient/caregiver).  Consent for procedure obtained. Time Out: Verified patient identification, verified procedure, site/side was marked, verified correct patient position, special equipment/implants available, medications/allergies/relevent history reviewed, required imaging and test results available.  Performed  Drugs Etomidate 20mg , Vecuronium 8mg  DL x 1 with GS 3 blade Grade 1 view 7.5 ET tube passed through cords under direct visualization, secured at 23cm Placement confirmed with bilateral breath sounds, positive EtCO2 change and smoke in tube   Evaluation Hemodynamic Status: BP stable throughout; O2 sats: stable throughout Patient's Current Condition: stable Complications: No apparent complications Patient did tolerate procedure well. Chest X-ray ordered to verify placement.  CXR: pending.   Max FickleMCQUAID, DOUGLAS 09/07/2014

## 2014-09-07 NOTE — Progress Notes (Signed)
Name: Ann Harrison MRN: [id number] DOB: 29-Aug-1934    ADMISSION DATE:  08/31/2014 CONSULTATION DATE:  2.22  REFERRING MD :  Hijazi   CHIEF COMPLAINT:  Respiratory failure  BRIEF PATIENT DESCRIPTION:  This is a 80 year old female with past medical history of diastolic dysfunction, hypertension, COPD, and chronic respiratory failure on 3 L. [facility] outside hospital after being treated for acute on chronic hypercarbic respiratory failure in the setting of acute exacerbation of COPD, with HCAP, and probable parapneumonic effusion. She was treated in the usual fashion which included empiric antibiotics, scheduled bronchodilators, systemic steroids, and noninvasive positive pressure ventilation. She also reportedly had a thoracentesis, unfortunately these results were not present. [facility], as she was unable to wean from noninvasive ventilation at the outside hospital. Pulmonary critical care has been asked to assess and assist with these efforts.  SIGNIFICANT EVENTS  2/24 Intubation   STUDIES:      SUBJECTIVE:  Called to bedside emergently for intubation  VITAL SIGNS: reviewed  PHYSICAL EXAMINATION: General: encephalopathic on vent HEENT: BIPAP mask in place PULM: diminished breath sounds bilaterall Ext: warm, well perfused AB: slightly distended, paradoxical movement Neuro: somnolent, non-arousable to external stimuli Derm: no rash or skin breakdown   Recent Labs Lab 09/05/14 1356 09/06/14 0725 09/07/14 0645  NA 136 134* 138  K 5.3* 5.8* 5.3*  CL 103 103 98  CO2 28 24 27   BUN 95* 103* 123*  CREATININE 2.10* 2.20* 3.09*  GLUCOSE 149* 249* 389*    Recent Labs Lab 09/03/14 0829 09/06/14 0836  HGB 8.7* 8.1*  HCT 27.3*  27.3* 25.6*  WBC 19.1* 16.7*  PLT 268 211   Dg Chest Port 1 View  09/05/2014   CLINICAL DATA:  80 year old female PICC line placement.  Initial encounter.  EXAM: PORTABLE CHEST - 1 VIEW  COMPARISON:  1020 hours today and earlier.  FINDINGS: Portable AP semi upright view at at 1330 hours. Right upper extremity approach PICC line terminates just below the carina. No pneumothorax. Stable cardiac size and mediastinal contours. Right pleural effusion re - identified. Ventilation is stable.  IMPRESSION: 1. Right PICC line placed, tip at the SVC level. 2. Otherwise stable chest.   Electronically Signed   By: Odessa FlemingH  Hall M.D.   On: 09/05/2014 14:11   Chest x-ray review 2/20: Probable pulmonary edema, right lower lobe atelectasis/collapse, probable loculated right effusion.  ASSESSMENT / PLAN: Acute on chronic hypercarbic respiratory failure > worse 2/24 due to hypercapnea and mucus plugging Healthcare associated pneumonia Loculated right pleural effusion Right lower lobe atelectasis/Collapse Acute exacerbation of chronic obstructive pulmonary disease Diastolic heart dysfunction Hyperkalemia Acute kidney injury Anemia of critical illness Diabetes Deconditioning  Discussion See previous notes.  She is more hypercapnic, in obvious respiratory failure due to severe COPD and atelectasis of the RLL.  She failed mucomyst, mucinex and conservative measures.   I explained to her son Sharon SellerLloyd that her chronic respiratory failure is severe and I cannot guarantee that she will come off the vent.  He voiced understanding and stated that he would not want her to be on the ventilator longer than 10 days.  Recommendation Stat intubation Full vent support Continue bronchodilators Bag lavage/pulm toilette q shift If no improvement in CXR, will bronch by the end of the week OG placement> start tube feedings   Independent critical care time by me 35 minutes  Heber CarolinaBrent McQuaid, MD [facility]  Pager: [phone] Cell: [phone] If no response, call [phone]    09/07/2014, 11:25 AM

## 2014-09-08 ENCOUNTER — Other Ambulatory Visit (HOSPITAL_COMMUNITY): Payer: Self-pay

## 2014-09-08 ENCOUNTER — Encounter: Payer: Self-pay | Admitting: Radiology

## 2014-09-08 DIAGNOSIS — J90 Pleural effusion, not elsewhere classified: Secondary | ICD-10-CM

## 2014-09-08 LAB — BASIC METABOLIC PANEL
ANION GAP: 10 (ref 5–15)
BUN: 115 mg/dL — ABNORMAL HIGH (ref 6–23)
CO2: 26 mmol/L (ref 19–32)
Calcium: 8.4 mg/dL (ref 8.4–10.5)
Chloride: 106 mmol/L (ref 96–112)
Creatinine, Ser: 2.46 mg/dL — ABNORMAL HIGH (ref 0.50–1.10)
GFR calc Af Amer: 21 mL/min — ABNORMAL LOW (ref >90)
GFR calc non Af Amer: 18 mL/min — ABNORMAL LOW (ref >90)
GLUCOSE: 137 mg/dL — AB (ref 70–99)
POTASSIUM: 3.9 mmol/L (ref 3.5–5.1)
Sodium: 142 mmol/L (ref 135–145)

## 2014-09-08 LAB — GLUCOSE, CAPILLARY: Glucose-Capillary: 178 mg/dL — ABNORMAL HIGH (ref 70–99)

## 2014-09-08 LAB — CBC
HCT: 24.4 % — ABNORMAL LOW (ref 36.0–46.0)
Hemoglobin: 8.4 g/dL — ABNORMAL LOW (ref 12.0–15.0)
MCH: 29.2 pg (ref 26.0–34.0)
MCHC: 34.4 g/dL (ref 30.0–36.0)
MCV: 84.7 fL (ref 78.0–100.0)
Platelets: 144 10*3/uL — ABNORMAL LOW (ref 150–400)
RBC: 2.88 MIL/uL — ABNORMAL LOW (ref 3.87–5.11)
RDW: 15.9 % — AB (ref 11.5–15.5)
WBC: 22.5 10*3/uL — ABNORMAL HIGH (ref 4.0–10.5)

## 2014-09-08 NOTE — Progress Notes (Signed)
Name: Ann PerchesMildred Strole MRN: [id number] DOB: 21-Nov-1934    ADMISSION DATE:  06/30/2015 CONSULTATION DATE:  2.22  REFERRING MD :  Hijazi   CHIEF COMPLAINT:  Respiratory failure  BRIEF PATIENT DESCRIPTION:  This is a 80 year old female with past medical history of diastolic dysfunction, hypertension, COPD, and chronic respiratory failure on 3 L. [facility] outside hospital after being treated for acute on chronic hypercarbic respiratory failure in the setting of acute exacerbation of COPD, with HCAP, and probable parapneumonic effusion. She was treated in the usual fashion which included empiric antibiotics, scheduled bronchodilators, systemic steroids, and noninvasive positive pressure ventilation. She also reportedly had a thoracentesis, unfortunately these results were not present. [facility], as she was unable to wean from noninvasive ventilation at the outside hospital. Pulmonary critical care has been asked to assess and assist with these efforts.  SIGNIFICANT EVENTS  2/24 Intubation   STUDIES:  2/25 CT chest >>    SUBJECTIVE:  Intubated yesterday, agitated on vent, uncomfortable  VITAL SIGNS: Reviewed, Tm 99.5  PHYSICAL EXAMINATION: General: agitated on vent HEENT: NCAT ETT in place, OP with excoriation PULM: wheeze left, diminished right CV: Brady, no mgr Ext: warm, well perfused, some edema AB: BS+, soft, nontender Neuro: agitation on vent, moves all four ext Derm: no rash or skin breakdown   Recent Labs Lab 09/06/14 0725 09/07/14 0645 09/08/14 0647  NA 134* 138 142  K 5.8* 5.3* 3.9  CL 103 98 106  CO2 24 27 26   BUN 103* 123* 115*  CREATININE 2.20* 3.09* 2.46*  GLUCOSE 249* 389* 137*    Recent Labs Lab 09/03/14 0829 09/06/14 0836 09/08/14 0647  HGB 8.7* 8.1* 8.4*  HCT 27.3*  27.3* 25.6* 24.4*  WBC 19.1* 16.7* 22.5*  PLT 268 211 144*   Dg Chest Port 1  View  09/08/2014   CLINICAL DATA:  Hypoxia  EXAM: PORTABLE CHEST - 1 VIEW  COMPARISON:  September 07, 2014  FINDINGS: Endotracheal tube tip is 4.9 cm above the carina. Central catheter tip is in the superior vena cava. No pneumothorax. There is right lower lobe consolidation with right effusion, stable. Left lung is clear. Previously noted small left effusion no longer appreciable. Heart is mildly enlarged with pulmonary vascularity within normal limits. No adenopathy.  IMPRESSION: Tube and catheter positions as described without pneumothorax. Persistent right lower lobe consolidation with right effusion, stable. Left lung clear. No change in cardiac silhouette.   Electronically Signed   By: Bretta BangWilliam  Woodruff III M.D.   On: 09/08/2014 08:03   Dg Chest Port 1 View  09/07/2014   CLINICAL DATA:  Hypoxia  EXAM: PORTABLE CHEST - 1 VIEW  COMPARISON:  September 05, 2014  FINDINGS: Endotracheal tube tip is 3.9 cm above the carina. Central catheter tip is in the superior vena cava. No pneumothorax. There is airspace consolidation in the right lower lobe with right effusion. There is a small left effusion. Left lung is otherwise clear. Heart is mildly enlarged with pulmonary vascularity within normal limits. Patient is status post coronary artery bypass grafting.  IMPRESSION: Tube and catheter positions as described without pneumothorax. Right lower lobe consolidation with right effusion. New small left effusion. No change in cardiac silhouette.   Electronically Signed   By: Bretta BangWilliam  Woodruff III M.D.   On: 09/07/2014 13:52   Chest x-ray review 2/25: likely right lung pleural effusion vs infiltrate RLLL  ASSESSMENT /  PLAN: Acute on chronic hypercarbic respiratory failure > worse 2/24 due to hypercapnea and mucus plugging? CXR now looks like recurrent pleural effusion Healthcare associated pneumonia Loculated right pleural effusion > seems worse 2/55 Right lower lobe atelectasis/Collapse Acute exacerbation of  chronic obstructive pulmonary disease Diastolic heart dysfunction Acute kidney injury Anemia of critical illness Diabetes Deconditioning  Discussion Intubated 2/24 per family request.  I explained that she has severe COPD and multi-organ failure with a poor prognosis.  They don't want to prolong mechanical ventilation longer than 10 days  CXR on 2/25 looks more like effusion > needs CT scan  Recommendation Continue vent support/propofol for sedation CT Chest now > if enlarging effusion would consult IR for thoracentesis D/C Mucomyst Full vent support Continue bronchodilators Bag lavage/pulm toilette q shift If no improvement in CXR, will bronch by the end of the week OG placement> start tube feedings   Independent critical care time by me 35 minutes  Heber [place], MD [place] [facility] Pager: [phone] Cell: [phone] If no response, call [phone]    09/08/2014, 11:11 AM

## 2014-09-09 ENCOUNTER — Other Ambulatory Visit (HOSPITAL_COMMUNITY): Payer: Self-pay

## 2014-09-10 ENCOUNTER — Other Ambulatory Visit (HOSPITAL_COMMUNITY): Payer: Self-pay

## 2014-09-10 LAB — LIPASE, BLOOD: LIPASE: 21 U/L (ref 11–59)

## 2014-09-10 LAB — BASIC METABOLIC PANEL
Anion gap: 7 (ref 5–15)
BUN: 82 mg/dL — ABNORMAL HIGH (ref 6–23)
CALCIUM: 8 mg/dL — AB (ref 8.4–10.5)
CHLORIDE: 114 mmol/L — AB (ref 96–112)
CO2: 24 mmol/L (ref 19–32)
Creatinine, Ser: 1.67 mg/dL — ABNORMAL HIGH (ref 0.50–1.10)
GFR calc Af Amer: 33 mL/min — ABNORMAL LOW (ref >90)
GFR calc non Af Amer: 28 mL/min — ABNORMAL LOW (ref >90)
Glucose, Bld: 294 mg/dL — ABNORMAL HIGH (ref 70–99)
Potassium: 4.7 mmol/L (ref 3.5–5.1)
Sodium: 145 mmol/L (ref 135–145)

## 2014-09-10 LAB — CBC
HEMATOCRIT: 24.7 % — AB (ref 36.0–46.0)
Hemoglobin: 8 g/dL — ABNORMAL LOW (ref 12.0–15.0)
MCH: 29.2 pg (ref 26.0–34.0)
MCHC: 32.4 g/dL (ref 30.0–36.0)
MCV: 90.1 fL (ref 78.0–100.0)
Platelets: 117 10*3/uL — ABNORMAL LOW (ref 150–400)
RBC: 2.74 MIL/uL — AB (ref 3.87–5.11)
RDW: 16.8 % — AB (ref 11.5–15.5)
WBC: 21.3 10*3/uL — AB (ref 4.0–10.5)

## 2014-09-10 LAB — HEPATIC FUNCTION PANEL
ALBUMIN: 2.9 g/dL — AB (ref 3.5–5.2)
ALT: 16 U/L (ref 0–35)
AST: 19 U/L (ref 0–37)
Alkaline Phosphatase: 31 U/L — ABNORMAL LOW (ref 39–117)
BILIRUBIN TOTAL: 0.4 mg/dL (ref 0.3–1.2)
Bilirubin, Direct: 0.1 mg/dL (ref 0.0–0.5)
Indirect Bilirubin: 0.3 mg/dL (ref 0.3–0.9)
Total Protein: 5.5 g/dL — ABNORMAL LOW (ref 6.0–8.3)

## 2014-09-11 ENCOUNTER — Other Ambulatory Visit (HOSPITAL_COMMUNITY): Payer: Self-pay

## 2014-09-11 LAB — CBC
HCT: 25.7 % — ABNORMAL LOW (ref 36.0–46.0)
Hemoglobin: 8.2 g/dL — ABNORMAL LOW (ref 12.0–15.0)
MCH: 29.1 pg (ref 26.0–34.0)
MCHC: 31.9 g/dL (ref 30.0–36.0)
MCV: 91.1 fL (ref 78.0–100.0)
Platelets: 97 10*3/uL — ABNORMAL LOW (ref 150–400)
RBC: 2.82 MIL/uL — AB (ref 3.87–5.11)
RDW: 17.2 % — ABNORMAL HIGH (ref 11.5–15.5)
WBC: 29.2 10*3/uL — ABNORMAL HIGH (ref 4.0–10.5)

## 2014-09-11 LAB — BASIC METABOLIC PANEL
Anion gap: 11 (ref 5–15)
BUN: 76 mg/dL — ABNORMAL HIGH (ref 6–23)
CO2: 27 mmol/L (ref 19–32)
CREATININE: 1.73 mg/dL — AB (ref 0.50–1.10)
Calcium: 8.5 mg/dL (ref 8.4–10.5)
Chloride: 111 mmol/L (ref 96–112)
GFR calc non Af Amer: 27 mL/min — ABNORMAL LOW (ref >90)
GFR, EST AFRICAN AMERICAN: 32 mL/min — AB (ref >90)
GLUCOSE: 171 mg/dL — AB (ref 70–99)
POTASSIUM: 5 mmol/L (ref 3.5–5.1)
SODIUM: 149 mmol/L — AB (ref 135–145)

## 2014-09-11 LAB — BRAIN NATRIURETIC PEPTIDE: B NATRIURETIC PEPTIDE 5: 468.6 pg/mL — AB (ref 0.0–100.0)

## 2014-09-12 DIAGNOSIS — J449 Chronic obstructive pulmonary disease, unspecified: Secondary | ICD-10-CM

## 2014-09-12 DIAGNOSIS — J9620 Acute and chronic respiratory failure, unspecified whether with hypoxia or hypercapnia: Secondary | ICD-10-CM

## 2014-09-12 LAB — COMPREHENSIVE METABOLIC PANEL
ALT: 17 U/L (ref 0–35)
ANION GAP: 8 (ref 5–15)
AST: 17 U/L (ref 0–37)
Albumin: 2.3 g/dL — ABNORMAL LOW (ref 3.5–5.2)
Alkaline Phosphatase: 35 U/L — ABNORMAL LOW (ref 39–117)
BILIRUBIN TOTAL: 0.8 mg/dL (ref 0.3–1.2)
BUN: 80 mg/dL — ABNORMAL HIGH (ref 6–23)
CHLORIDE: 113 mmol/L — AB (ref 96–112)
CO2: 26 mmol/L (ref 19–32)
CREATININE: 1.88 mg/dL — AB (ref 0.50–1.10)
Calcium: 8.1 mg/dL — ABNORMAL LOW (ref 8.4–10.5)
GFR, EST AFRICAN AMERICAN: 29 mL/min — AB (ref >90)
GFR, EST NON AFRICAN AMERICAN: 25 mL/min — AB (ref >90)
Glucose, Bld: 167 mg/dL — ABNORMAL HIGH (ref 70–99)
POTASSIUM: 4.4 mmol/L (ref 3.5–5.1)
SODIUM: 147 mmol/L — AB (ref 135–145)
Total Protein: 5 g/dL — ABNORMAL LOW (ref 6.0–8.3)

## 2014-09-12 LAB — CBC WITH DIFFERENTIAL/PLATELET
BASOS ABS: 0 10*3/uL (ref 0.0–0.1)
BASOS PCT: 0 % (ref 0–1)
EOS PCT: 0 % (ref 0–5)
Eosinophils Absolute: 0 10*3/uL (ref 0.0–0.7)
HCT: 23.3 % — ABNORMAL LOW (ref 36.0–46.0)
Hemoglobin: 7.5 g/dL — ABNORMAL LOW (ref 12.0–15.0)
LYMPHS ABS: 2.4 10*3/uL (ref 0.7–4.0)
Lymphocytes Relative: 12 % (ref 12–46)
MCH: 28.5 pg (ref 26.0–34.0)
MCHC: 32.2 g/dL (ref 30.0–36.0)
MCV: 88.6 fL (ref 78.0–100.0)
Monocytes Absolute: 0.4 10*3/uL (ref 0.1–1.0)
Monocytes Relative: 2 % — ABNORMAL LOW (ref 3–12)
Neutro Abs: 16.7 10*3/uL — ABNORMAL HIGH (ref 1.7–7.7)
Neutrophils Relative %: 85 % — ABNORMAL HIGH (ref 43–77)
Platelets: 84 10*3/uL — ABNORMAL LOW (ref 150–400)
RBC: 2.63 MIL/uL — AB (ref 3.87–5.11)
RDW: 17.3 % — AB (ref 11.5–15.5)
WBC: 19.5 10*3/uL — ABNORMAL HIGH (ref 4.0–10.5)

## 2014-09-12 LAB — MAGNESIUM: Magnesium: 3.1 mg/dL — ABNORMAL HIGH (ref 1.5–2.5)

## 2014-09-12 LAB — TRIGLYCERIDES: TRIGLYCERIDES: 117 mg/dL (ref <150)

## 2014-09-12 NOTE — Progress Notes (Signed)
Name: Ann Harrison MRN: [id number] DOB: 1934/11/05    ADMISSION DATE:  23-Sep-2014 CONSULTATION DATE:  2.22  REFERRING MD :  Hijazi   CHIEF COMPLAINT:  Respiratory failure  BRIEF PATIENT DESCRIPTION:  This is a 80 year old female with past medical history of diastolic dysfunction, hypertension, COPD, and chronic respiratory failure on 3 L. [facility] hospital after being treated for acute on chronic hypercarbic respiratory failure in the setting of acute exacerbation of COPD, with HCAP, and probable parapneumonic effusion. She was treated in the usual fashion which included empiric antibiotics, scheduled bronchodilators, systemic steroids, and noninvasive positive pressure ventilation. She also reportedly had a thoracentesis, unfortunately these results were not present. [facility], as she was unable to wean from noninvasive ventilation at the outside hospital. Pulmonary critical care has been asked to assess and assist with these efforts.  SIGNIFICANT EVENTS  2/24 Intubation (rescinded DNI by family but goal was short term ventilation)  2/25 CT chest >>  bialteral effuson, RUL mass - ? Cancer v Pneumonia   SUBJECTIVE/OVERNIGHT/INTERVAL HX 09/12/2014: On vent. Per RT - goal is time limited trial and not for long term vent per her understanding of goals d/w MD. Now on 40% fio2, peep 8 on PCV. On diprivan gtt. Sync with vent   VITAL SIGNS: Reviewed,  PHYSICAL EXAMINATION: General: on vent HEENT: NCAT ETT in place, OP with excoriation PULM: wheeze left, diminished right CV: Brady, no mgr Ext: warm, well perfused, some edema AB: BS+, soft, nontender Neuro: agitation on vent, moves all four ext Derm: no rash or skin breakdown   Recent Labs Lab 09/10/14 0430 09/11/14 0743 09/12/14 0610  NA 145 149* 147*  K 4.7 5.0 4.4  CL 114* 111 113*  CO2 BUN 82* 76* 80*    CREATININE 1.67* 1.73* 1.88*  GLUCOSE 294* 171* 167*    Recent Labs Lab 09/10/14 0430 09/11/14 0743 09/12/14 0610  HGB 8.0* 8.2* 7.5*  HCT 24.7* 25.7* 23.3*  WBC 21.3* 29.2* 19.5*  PLT 117* 97* 84*   Dg Chest Port 1 View  09/11/2014   CLINICAL DATA:  Check CHF changes.  EXAM: PORTABLE CHEST - 1 VIEW  COMPARISON:  09/10/2014  FINDINGS: Endotracheal tube is in place with tip approximately 2.9 cm above carina. Nasogastric tube is in place with tip beyond the imaged but beyond gastroesophageal junction. Right-sided PICC line tip overlies the level of the superior vena cava.  Heart is enlarged. There are prominent interstitial markings, unchanged. Known right upper lobe mass is obscured by parenchymal opacities.  IMPRESSION: Cardiomegaly and mild interstitial edema.  Known right upper lobe mass is obscured.   Electronically Signed   By: Norva Pavlov M.D.   On: 09/11/2014 09:32   Dg Abd Portable 1v  09/10/2014   CLINICAL DATA:  Abdominal pain, evaluate for obstruction  EXAM: PORTABLE ABDOMEN - 1 VIEW  COMPARISON:  09/08/2014  FINDINGS: Enteric tube terminates in the stomach.  Mildly prominent but nondilated loops of bowel in the left mid abdomen.  Colon is nondistended.  Overall, this appearance is considered nonobstructive.  Postsurgical changes involving the lower lumbar spine.  IMPRESSION: Enteric tube terminates in the stomach.  Nonobstructive bowel gas pattern.   Electronically Signed   By: Charline Bills M.D.   On: 09/10/2014 14:00   Chest x-ray review 2/25: likely right lung pleural effusion vs infiltrate RLLL  ASSESSMENT /  PLAN: Acute on chronic hypercarbic respiratory failure > worse 2/24 due to hypercapnea and mucus plugging? CXR now looks like recurrent pleural effusion Healthcare associated pneumonia Loculated right pleural effusion > seems worse 2/55 Right lower lobe atelectasis/Collapse Acute exacerbation of chronic obstructive pulmonary disease Diastolic heart  dysfunction Acute kidney injury Anemia of critical illness Diabetes Deconditioning   Intubated 2/24 per family request.    They don't want to prolong mechanical ventilation longer than 10 days   09/12/14: very vent dependent. Unable to wean. CXR does not show much effusion   Recommendation Continue vent support/propofol for sedation Recommend terminal wean if unimproved next few to several days [facility]   Dr. Kalman ShanMurali Adreana Coull, M.D., [facility].C.P Pulmonary and Critical Care Medicine Staff Physician [facility] and Critical [facility] Pager: [phone], If no answer or between  15:00h - 7:00h: call [phone]  09/12/2014 12:35 PM

## 2014-09-13 ENCOUNTER — Other Ambulatory Visit (HOSPITAL_COMMUNITY): Payer: Self-pay

## 2014-09-13 LAB — TSH: TSH: 1.125 u[IU]/mL (ref 0.350–4.500)

## 2014-09-13 LAB — BODY FLUID CELL COUNT WITH DIFFERENTIAL
EOS FL: 0 %
Lymphs, Fluid: 3 %
Monocyte-Macrophage-Serous Fluid: 44 % — ABNORMAL LOW (ref 50–90)
Neutrophil Count, Fluid: 53 % — ABNORMAL HIGH (ref 0–25)
Total Nucleated Cell Count, Fluid: 44 cu mm (ref 0–1000)

## 2014-09-13 LAB — CULTURE, RESPIRATORY

## 2014-09-13 LAB — CULTURE, RESPIRATORY W GRAM STAIN: Gram Stain: NONE SEEN

## 2014-09-13 LAB — CBC
HEMATOCRIT: 22.9 % — AB (ref 36.0–46.0)
HEMOGLOBIN: 7.4 g/dL — AB (ref 12.0–15.0)
MCH: 29.1 pg (ref 26.0–34.0)
MCHC: 32.3 g/dL (ref 30.0–36.0)
MCV: 90.2 fL (ref 78.0–100.0)
PLATELETS: 71 10*3/uL — AB (ref 150–400)
RBC: 2.54 MIL/uL — ABNORMAL LOW (ref 3.87–5.11)
RDW: 17.3 % — ABNORMAL HIGH (ref 11.5–15.5)
WBC: 23.6 10*3/uL — AB (ref 4.0–10.5)

## 2014-09-13 LAB — GLUCOSE, SEROUS FLUID: GLUCOSE FL: 114 mg/dL

## 2014-09-13 MED ORDER — LIDOCAINE HCL (PF) 1 % IJ SOLN
INTRAMUSCULAR | Status: AC
  Filled 2014-09-13: qty 10

## 2014-09-13 NOTE — Procedures (Signed)
Successful US guided right thoracentesis. Yielded 430mL of clear yellow fluid. Pt tolerated procedure well. No immediate complications.  Specimen was sent for labs. CXR ordered.  Brayton ElBRUNING, Lexton Hidalgo PA-C 09/13/2014 11:27 AM

## 2014-09-13 DEATH — deceased

## 2014-09-14 LAB — CBC
HEMATOCRIT: 22.3 % — AB (ref 36.0–46.0)
Hemoglobin: 7.2 g/dL — ABNORMAL LOW (ref 12.0–15.0)
MCH: 29.1 pg (ref 26.0–34.0)
MCHC: 32.3 g/dL (ref 30.0–36.0)
MCV: 90.3 fL (ref 78.0–100.0)
Platelets: 73 10*3/uL — ABNORMAL LOW (ref 150–400)
RBC: 2.47 MIL/uL — AB (ref 3.87–5.11)
RDW: 17.4 % — AB (ref 11.5–15.5)
WBC: 16 10*3/uL — ABNORMAL HIGH (ref 4.0–10.5)

## 2014-09-14 LAB — BASIC METABOLIC PANEL
ANION GAP: 15 (ref 5–15)
BUN: 105 mg/dL — ABNORMAL HIGH (ref 6–23)
CALCIUM: 7.9 mg/dL — AB (ref 8.4–10.5)
CO2: 22 mmol/L (ref 19–32)
CREATININE: 2.35 mg/dL — AB (ref 0.50–1.10)
Chloride: 109 mmol/L (ref 96–112)
GFR calc Af Amer: 22 mL/min — ABNORMAL LOW (ref >90)
GFR calc non Af Amer: 19 mL/min — ABNORMAL LOW (ref >90)
GLUCOSE: 121 mg/dL — AB (ref 70–99)
Potassium: 4.2 mmol/L (ref 3.5–5.1)
SODIUM: 146 mmol/L — AB (ref 135–145)

## 2014-09-14 LAB — HEPARIN INDUCED THROMBOCYTOPENIA PNL: HEPARIN INDUCED PLT AB: 0.081 {OD_unit} (ref 0.000–0.400)

## 2014-09-14 LAB — TSH: TSH: 0.919 u[IU]/mL (ref 0.350–4.500)

## 2014-09-14 LAB — CLOSTRIDIUM DIFFICILE BY PCR: Toxigenic C. Difficile by PCR: NEGATIVE

## 2014-09-15 LAB — VANCOMYCIN, TROUGH: VANCOMYCIN TR: 18.9 ug/mL (ref 10.0–20.0)

## 2014-09-15 LAB — PATHOLOGIST SMEAR REVIEW

## 2014-09-15 NOTE — Progress Notes (Signed)
Name: Ann Harrison MRN: [id number] DOB: Oct 28, 1934    ADMISSION DATE:  09/05/2014 CONSULTATION DATE:  2.22 LOS    REFERRING MD :  Hijazi   CHIEF COMPLAINT:  Respiratory failure  BRIEF PATIENT DESCRIPTION:  This is a 80 year old female with past medical history of diastolic dysfunction, hypertension, COPD, and chronic respiratory failure on 3 L. [facility] from outside hospital after being treated for acute on chronic hypercarbic respiratory failure in the setting of acute exacerbation of COPD, with HCAP, and probable parapneumonic effusion. She was treated in the usual fashion which included empiric antibiotics, scheduled bronchodilators, systemic steroids, and noninvasive positive pressure ventilation. She also reportedly had a thoracentesis, unfortunately these results were not present. [facility], as she was unable to wean from noninvasive ventilation at the outside hospital. Pulmonary critical care has been asked to assess and assist with these efforts.  SIGNIFICANT EVENTS  2/24 Intubation (rescinded DNI by family but goal was short term ventilation)  2/25 CT chest >>  bialteral effuson, RUL mass - ? Cancer v Pneumonia 2./29/16: On vent. Per RT - goal is time limited trial and not for long term vent per her understanding of goals d/w MD. Now on 40% fio2, peep 8 on PCV. On diprivan gtt. Sync with vent   SUBJECTIVE/OVERNIGHT/INTERVAL HX 09/15/2014: s/p Right thora yesterday removing 400cc - Failed SBT again today. Daughter in law and son at bedside.   VITAL SIGNS: Reviewed,  PHYSICAL EXAMINATION: General: on vent, obese HEENT: NCAT ETT in place, OP with excoriation PULM: scattered faint wheeze + CV: Brady, no mgr Ext: warm, well perfused, some edema AB: BS+, soft, nontender Neuro: agitation on vent, moves all four ext Derm: no rash or skin breakdown   PULMONARY No results for  input(s): PHART, PCO2ART, PO2ART, HCO3, TCO2, O2SAT in the last 168 hours.  Invalid input(s): PCO2, PO2  CBC  Recent Labs Lab 09/12/14 0610 09/13/14 1305 09/14/14 0800  HGB 7.5* 7.4* 7.2*  HCT 23.3* 22.9* 22.3*  WBC 19.5* 23.6* 16.0*  PLT 84* 71* 73*    COAGULATION No results for input(s): INR in the last 168 hours.  CARDIAC  No results for input(s): TROPONINI in the last 168 hours. No results for input(s): PROBNP in the last 168 hours.   CHEMISTRY  Recent Labs Lab 09/10/14 0430 09/11/14 0743 09/12/14 0610 09/14/14 0800  NA 145 149* 147* 146*  K 4.7 5.0 4.4 4.2  CL 114* 111 113* 109  CO2 GLUCOSE 294* 171* 167* 121*  BUN 82* 76* 80* 105*  CREATININE 1.67* 1.73* 1.88* 2.35*  CALCIUM 8.0* 8.5 8.1* 7.9*  MG  --   --  3.1*  --    CrCl cannot be calculated (Unknown ideal weight.).   LIVER  Recent Labs Lab 09/10/14 1600 09/12/14 0610  AST 19 17  ALT 16 17  ALKPHOS 31* 35*  BILITOT 0.4 0.8  PROT 5.5* 5.0*  ALBUMIN 2.9* 2.3*     INFECTIOUS No results for input(s): LATICACIDVEN, PROCALCITON in the last 168 hours.   ENDOCRINE CBG (last 3)  No results for input(s): GLUCAP in the last 72 hours.       IMAGING x48h Dg Chest Port 1 View  09/13/2014   CLINICAL DATA:  Right pleural effusion, status post thoracentesis  EXAM: PORTABLE CHEST - 1 VIEW  COMPARISON:  Portable chest x-ray of September 11, 2014  FINDINGS: The  lungs are adequately inflated. The interstitial markings are less prominent today. No significant pleural effusion is demonstrated. There is note pneumothorax. The cardiopericardial silhouette remains enlarged. The central pulmonary vascularity remains prominent centrally but the cephalization has decreased. There is no pleural effusion.  The endotracheal tube tip lies approximately 2.6 cm above the crotch of the carina. The esophagogastric tube tip projects below the inferior margin of the image. The right-sided PICC line tip  projects over the midportion of the SVC.  IMPRESSION: 1. There is no evidence of a postprocedure complication following right sided thoracentesis. 2. Interval improvement in the appearance of the pulmonary interstitium consistent with resolving interstitial edema. Mild CHF persists.   Electronically Signed   By: David  [place]   On: 09/13/2014 13:27   US Thoracentesis Asp Pleural Space W/img Guide  09/13/2014   CLINICAL DATA:  Respiratory failure, right-sided pleural effusion. Request diagnostic and therapeutic thoracentesis.  EXAM: ULTRASOUND GUIDED RIGHT THORACENTESIS  COMPARISON:  None.  PROCEDURE: An ultrasound guided thoracentesis was thoroughly discussed with the patient and questions answered. The benefits, risks, alternatives and complications were also discussed. The patient understands and wishes to proceed with the procedure. Written consent was obtained.  Ultrasound was performed to localize and mark an adequate pocket of fluid in the right chest. The area was then prepped and draped in the normal sterile fashion. 1% Lidocaine was used for local anesthesia. Under ultrasound guidance a 19 gauge Yueh catheter was introduced. Thoracentesis was performed. The catheter was removed and a dressing applied.  COMPLICATIONS: None immediate.  FINDINGS: A total of approximately 430 mL of clear yellow fluid was removed. A fluid sample wassent for laboratory analysis.  IMPRESSION: Successful ultrasound guided right thoracentesis yielding 430 mL of pleural fluid.  Read by: Brayton El PA-C   Electronically Signed   By: Jolaine Click M.D.   On: 09/13/2014 11:29     MEDS vanc levaquin Diflucan methylpred LABA bid Pulmicort bid No atrovent     ASSESSMENT / PLAN: Acute on chronic hypercarbic respiratory failure   - Acute exacerbation of chronic obstructive pulmonary disease Diastolic heart dysfunction Acute kidney injury Anemia of critical illness Diabetes Deconditioning   Intubated 2/24 per  family request.    They don't want to prolong mechanical ventilation longer than 10 days  09/15/14: d/w d-i-l and son at bedside. Both affirm short term vent only and no trach. Another son in [place] reportedly on board. Patient clearly indicated in presence of MD, family, RN - NO TRACh  Recommendation Increase neb frequency Try lasix IF still fails SBT next 1-2 days, then consider terminal wean Full medical care but DNR  Family in agreement and updated     The patient is critically ill with multiple organ systems failure and requires high complexity decision making for assessment and support, frequent evaluation and titration of therapies, application of advanced monitoring technologies and extensive interpretation of multiple databases.   Critical Care Time devoted to patient care services described in this note is  30  Minutes. This time reflects time of care of this signee Dr Kalman Shan. This critical care time does not reflect procedure time, or teaching time or supervisory time of PA/NP/Med student/Med Resident etc but could involve care discussion time    Dr. Kalman Shan, M.D., [facility].C.P Pulmonary and Critical Care Medicine Staff Physician [facility]  Pulmonary and Critical Care Pager: [phone], If no answer or between  15:00h - 7:00h: call [phone]  [phone]  09/15/2014 11:21 AM

## 2014-09-16 LAB — BASIC METABOLIC PANEL
Anion gap: 12 (ref 5–15)
BUN: 122 mg/dL — AB (ref 6–23)
CO2: 21 mmol/L (ref 19–32)
CREATININE: 2.58 mg/dL — AB (ref 0.50–1.10)
Calcium: 7.9 mg/dL — ABNORMAL LOW (ref 8.4–10.5)
Chloride: 112 mmol/L (ref 96–112)
GFR, EST AFRICAN AMERICAN: 20 mL/min — AB (ref >90)
GFR, EST NON AFRICAN AMERICAN: 17 mL/min — AB (ref >90)
GLUCOSE: 89 mg/dL (ref 70–99)
Potassium: 4.8 mmol/L (ref 3.5–5.1)
Sodium: 145 mmol/L (ref 135–145)

## 2014-09-16 LAB — BLOOD GAS, ARTERIAL
Acid-base deficit: 3.5 mmol/L — ABNORMAL HIGH (ref 0.0–2.0)
Bicarbonate: 20.4 mEq/L (ref 20.0–24.0)
FIO2: 0.4 %
O2 Saturation: 98.4 %
PEEP: 5 cmH2O
PH ART: 7.405 (ref 7.350–7.450)
PO2 ART: 102 mmHg — AB (ref 80.0–100.0)
Patient temperature: 98.6
Pressure control: 28 cmH2O
RATE: 20 resp/min
TCO2: 21.5 mmol/L (ref 0–100)
pCO2 arterial: 33.3 mmHg — ABNORMAL LOW (ref 35.0–45.0)

## 2014-09-16 LAB — BRAIN NATRIURETIC PEPTIDE: B NATRIURETIC PEPTIDE 5: 750.7 pg/mL — AB (ref 0.0–100.0)

## 2014-09-17 LAB — BODY FLUID CULTURE: Culture: NO GROWTH

## 2014-09-19 ENCOUNTER — Other Ambulatory Visit (HOSPITAL_COMMUNITY): Payer: Self-pay

## 2014-09-19 LAB — CBC
HCT: 16.9 % — ABNORMAL LOW (ref 36.0–46.0)
Hemoglobin: 5.3 g/dL — CL (ref 12.0–15.0)
MCH: 28.2 pg (ref 26.0–34.0)
MCHC: 31.4 g/dL (ref 30.0–36.0)
MCV: 89.9 fL (ref 78.0–100.0)
Platelets: 91 10*3/uL — ABNORMAL LOW (ref 150–400)
RBC: 1.88 MIL/uL — ABNORMAL LOW (ref 3.87–5.11)
RDW: 17.8 % — ABNORMAL HIGH (ref 11.5–15.5)
WBC: 12.9 10*3/uL — ABNORMAL HIGH (ref 4.0–10.5)

## 2014-09-19 LAB — BASIC METABOLIC PANEL
ANION GAP: 7 (ref 5–15)
BUN: 172 mg/dL — ABNORMAL HIGH (ref 6–23)
CHLORIDE: 117 mmol/L — AB (ref 96–112)
CO2: 21 mmol/L (ref 19–32)
CREATININE: 2.85 mg/dL — AB (ref 0.50–1.10)
Calcium: 8.2 mg/dL — ABNORMAL LOW (ref 8.4–10.5)
GFR calc Af Amer: 17 mL/min — ABNORMAL LOW (ref >90)
GFR calc non Af Amer: 15 mL/min — ABNORMAL LOW (ref >90)
Glucose, Bld: 254 mg/dL — ABNORMAL HIGH (ref 70–99)
POTASSIUM: 6 mmol/L — AB (ref 3.5–5.1)
Sodium: 145 mmol/L (ref 135–145)

## 2014-10-14 NOTE — Progress Notes (Signed)
Name: Ann PerchesMildred Harrison MRN: [id number] DOB: [birth year]    ADMISSION DATE:  08/17/2014 CONSULTATION DATE:  2.22 LOS    REFERRING MD :  Hijazi   CHIEF COMPLAINT:  Respiratory failure  BRIEF PATIENT DESCRIPTION:  This is a 80 year old female with past medical history of diastolic dysfunction, hypertension, COPD, and chronic respiratory failure on 3 L. [facility] outside hospital after being treated for acute on chronic hypercarbic respiratory failure in the setting of acute exacerbation of COPD, with HCAP, and probable parapneumonic effusion. She was treated in the usual fashion which included empiric antibiotics, scheduled bronchodilators, systemic steroids, and noninvasive positive pressure ventilation. She also reportedly had a thoracentesis, unfortunately these results were not present. [facility], as she was unable to wean from noninvasive ventilation at the outside hospital. Pulmonary critical care has been asked to assess and assist with these efforts.  SIGNIFICANT EVENTS  2/24 Intubation (rescinded DNI by family but goal was short term ventilation)  2/25 CT chest >>  bialteral effuson, RUL mass - ? Cancer v Pneumonia 2./29/16: On vent. Per RT - goal is time limited trial and not for long term vent per her understanding of goals d/w MD. Now on 40% fio2, peep 8 on PCV. On diprivan gtt. Sync with vent 3/5 full comfort care, extubated  SUBJECTIVE/OVERNIGHT/INTERVAL HX 3/7 full comfort care  VITAL SIGNS: Reviewed,  PHYSICAL EXAMINATION: General: extubated on intermittent mso4 HEENT:no jvd PULM: scattered faint wheeze +, agonal breathing  CV: Brady, no mgr Ext: warm, well perfused, some edema AB: BS+, soft, nontender Neuro: obtunded Derm: no rash or skin breakdown   PULMONARY  Recent Labs Lab 09/16/14 0539  PHART 7.405  PCO2ART 33.3*  PO2ART 102.0*  HCO3 20.4  TCO2 21.5    O2SAT 98.4    CBC  Recent Labs Lab 09/13/14 1305 09/14/14 0800  HGB 7.4* 7.2*  HCT 22.9* 22.3*  WBC 23.6* 16.0*  PLT 71* 73*    COAGULATION No results for input(s): INR in the last 168 hours.  CARDIAC  No results for input(s): TROPONINI in the last 168 hours. No results for input(s): PROBNP in the last 168 hours.   CHEMISTRY  Recent Labs Lab 09/14/14 0800 09/16/14 0528  NA 146* 145  K 4.2 4.8  CL 109 112  CO2 22 21  GLUCOSE 121* 89  BUN 105* 122*  CREATININE 2.35* 2.58*  CALCIUM 7.9* 7.9*   CrCl cannot be calculated (Unknown ideal weight.).       IMAGING x48h No results found.      ASSESSMENT / PLAN: Acute on chronic hypercarbic respiratory failure   - Acute exacerbation of chronic obstructive pulmonary disease Diastolic heart dysfunction Acute kidney injury Anemia of critical illness Diabetes Deconditioning   Intubated 2/24 per family request.    They don't want to prolong mechanical ventilation longer than 10 days  09/15/14: d/w d-i-l and son at bedside. Both affirm short term vent only and no trach. [place] board. Patient clearly indicated in presence of MD, family, RN - NO TRACh  Recommendation Now full comfort care. PCCM will sign off 3/7.  Brett CanalesSteve Minor ACNP [facility] PCCM Pager [phone] till 3 pm If no answer page [phone]  Patient is now full comfort care since she refused tracheostomy.  Focus more on comfort at this point.  Little for PCCM to do, will sign off, please call back if needed.  Patient seen and examined, agree with above note.  I dictated the care and orders written for this patient under my direction.  Alyson Reedy, MD [phone]  09/29/2014, 10:25 AM

## 2014-10-14 DEATH — deceased

## 2016-08-23 IMAGING — CR DG CHEST 1V PORT
1 series · 1 of 1 positions shown · non-contrast
Comparison: 09/03/2014

CLINICAL DATA: Respiratory failure.

EXAM:
PORTABLE CHEST - 1 VIEW

[portable]
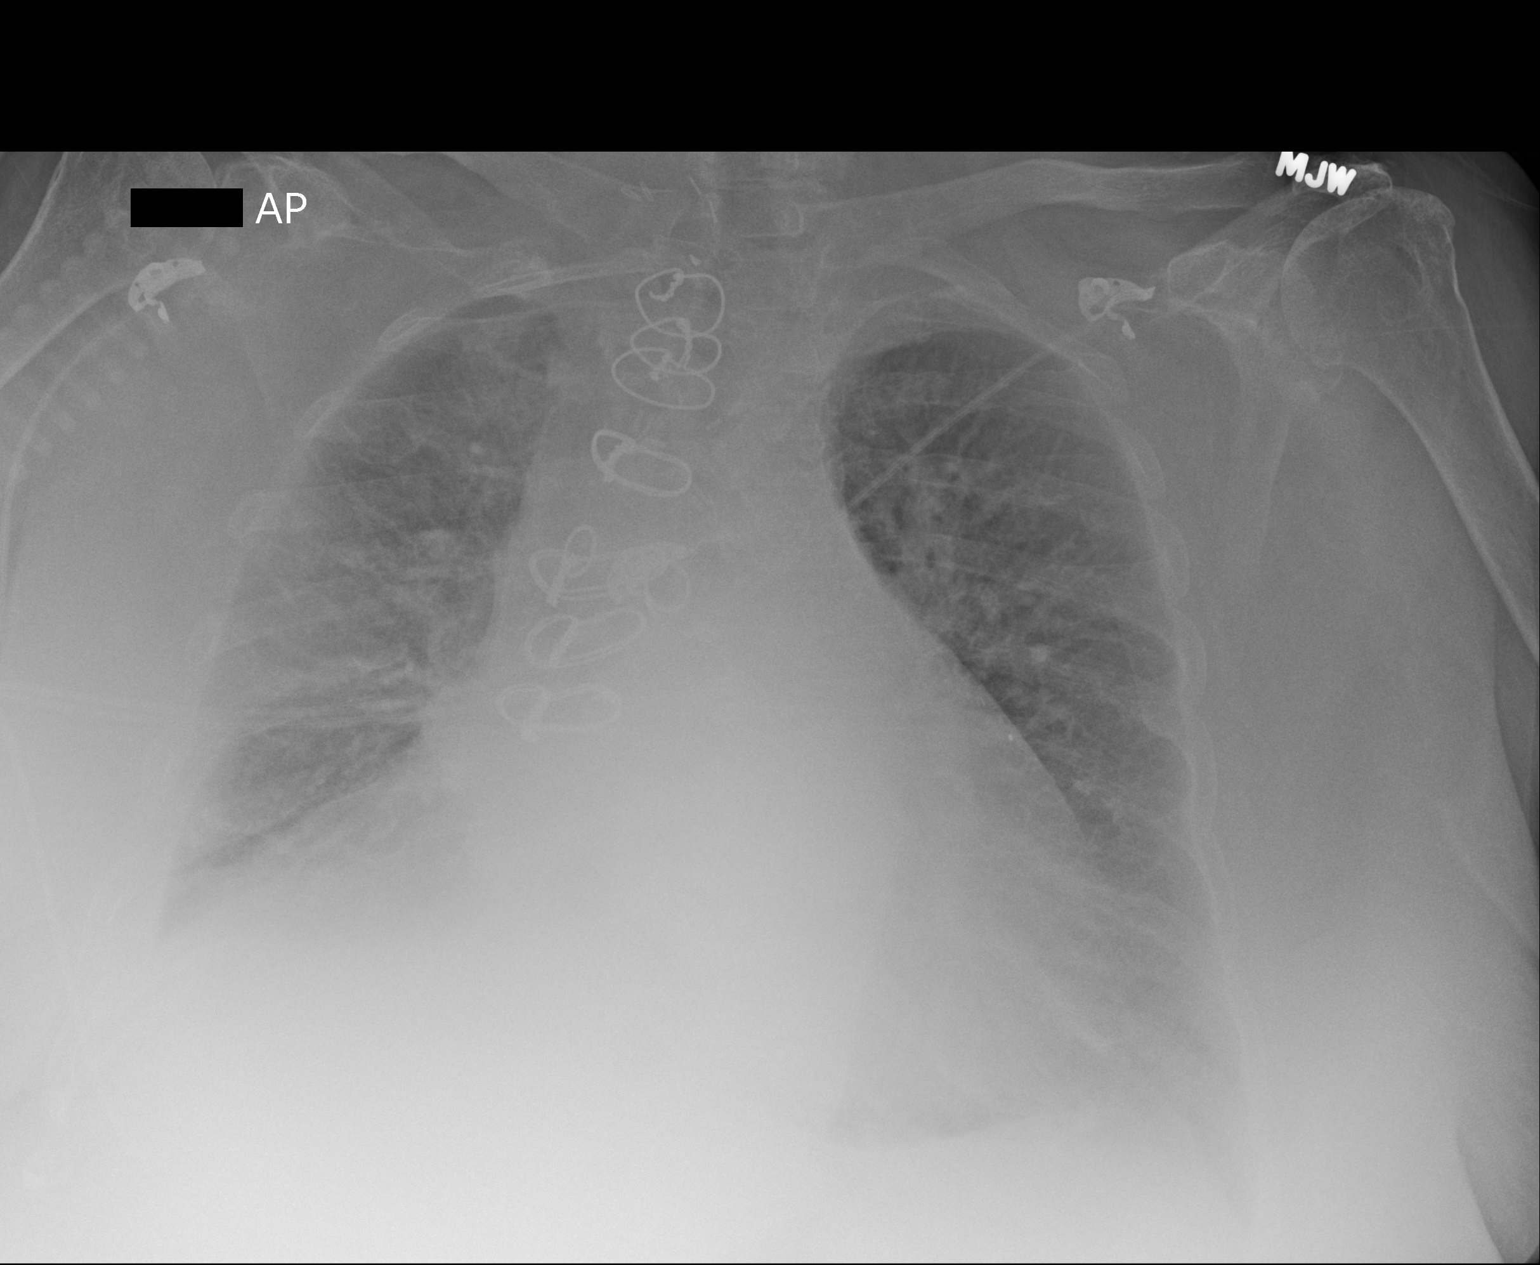

[1 of 1 positions shown; findings below may reference images not displayed]

FINDINGS: Prior CABG. Cardiomegaly diffuse interstitial prominence throughout
the lungs with vascular congestion, likely mild CHF. Small right
pleural effusion is similar to prior study. No significant visible
left effusion. No acute bony abnormality.
IMPRESSION: Presumed mild CHF, unchanged.  Small right effusion.

## 2016-08-26 IMAGING — CT CT CHEST W/O CM
2 of 4 series · 15 of 36 positions shown, 18 images · non-contrast
Comparison: Chest radiograph from 09/08/2014

CLINICAL DATA: Acute respiratory failure. Evaluate pleural
effusions status post thoracentesis.

EXAM:
CT CHEST WITHOUT CONTRAST
TECHNIQUE: Multidetector CT imaging of the chest was performed following the
standard protocol without IV contrast..

[Series 2: thorax 5.0 i31f 1 · axial · 0.88mm/px · z∈[-625,-385]mm · 12 of 58 slices shown, 15 images]
[im 5/58  mediastinal]
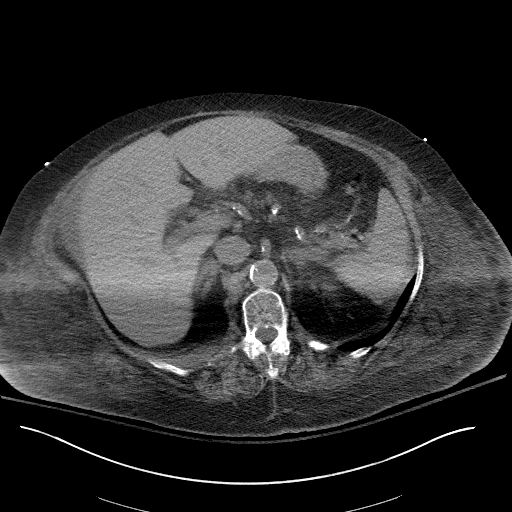
[im 5/58  lung]
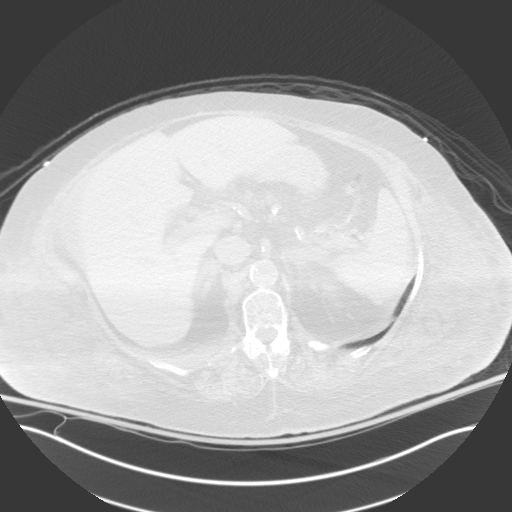
[im 9/58  lung]
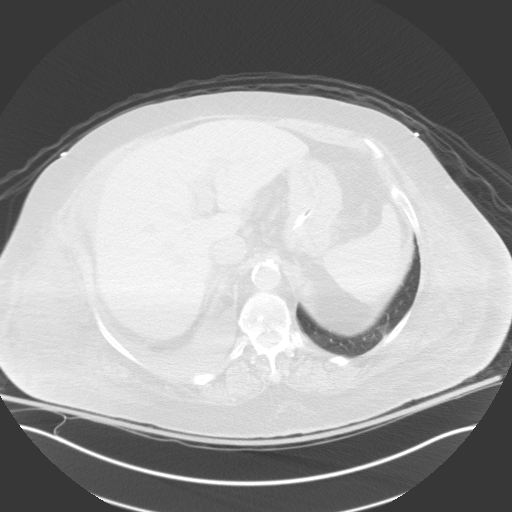
[im 13/58  lung]
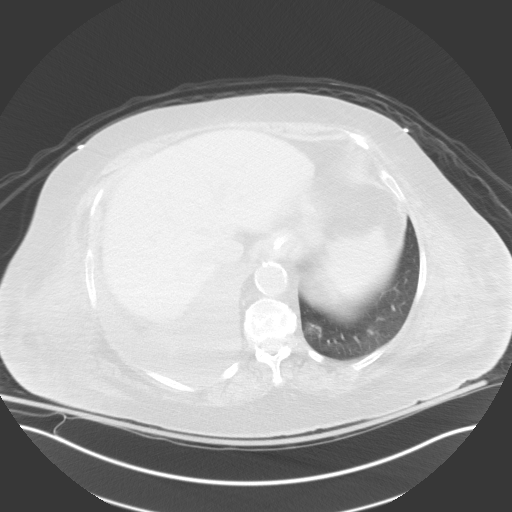
[im 17/58  lung]
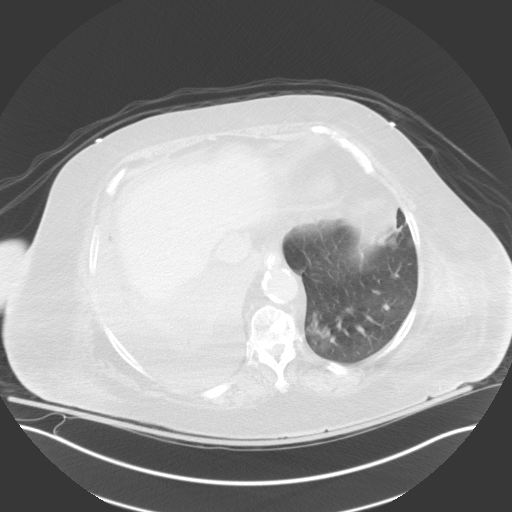
[im 21/58  mediastinal]
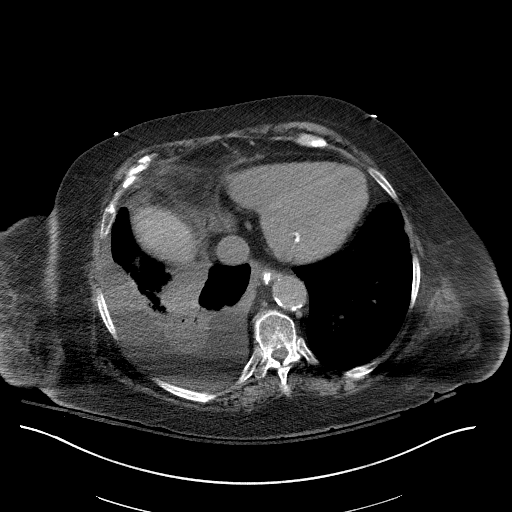
[im 21/58  lung]
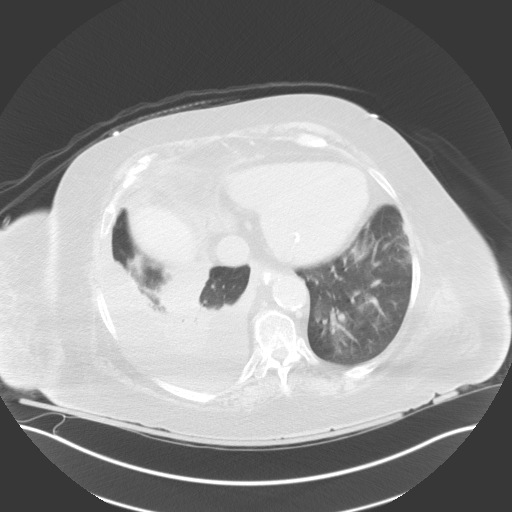
[im 25/58  lung]
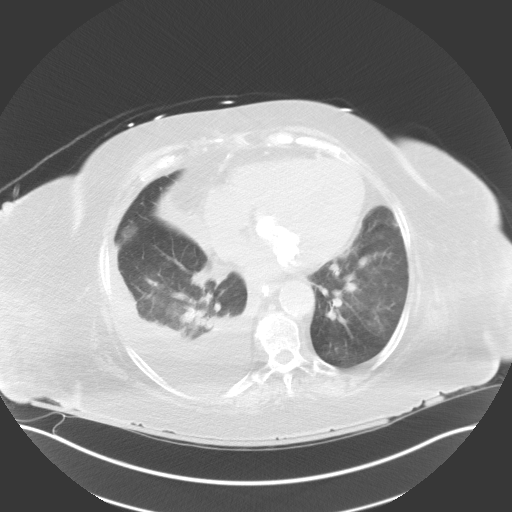
[im 33/58  lung]
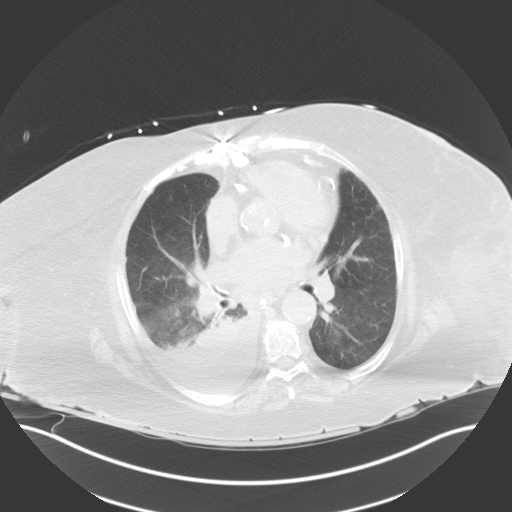
[im 37/58  lung]
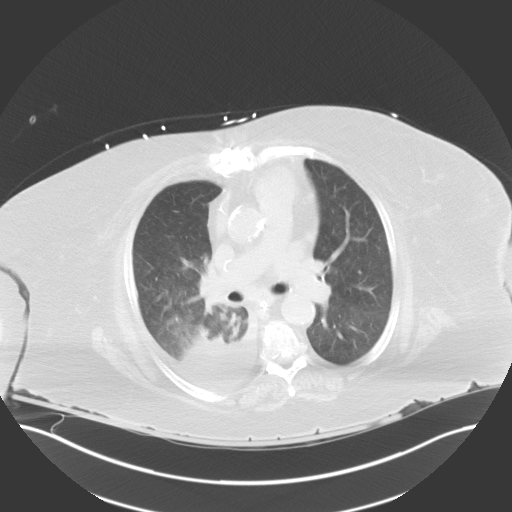
[im 41/58  mediastinal]
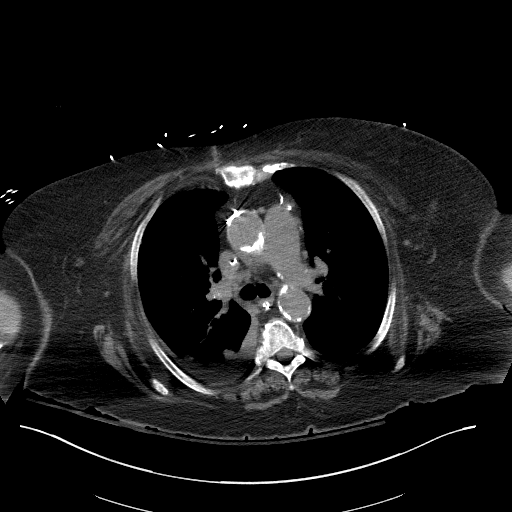
[im 41/58  lung]
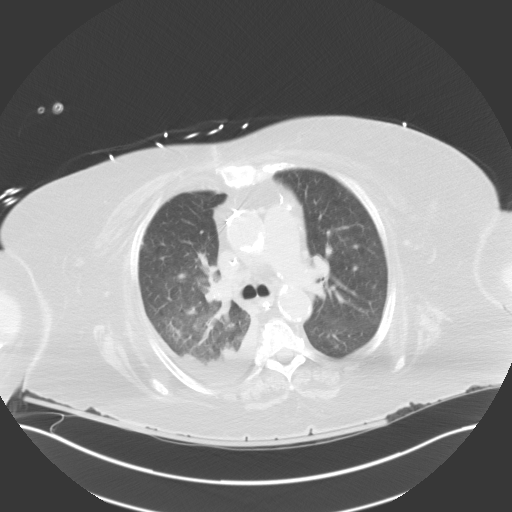
[im 45/58  lung]
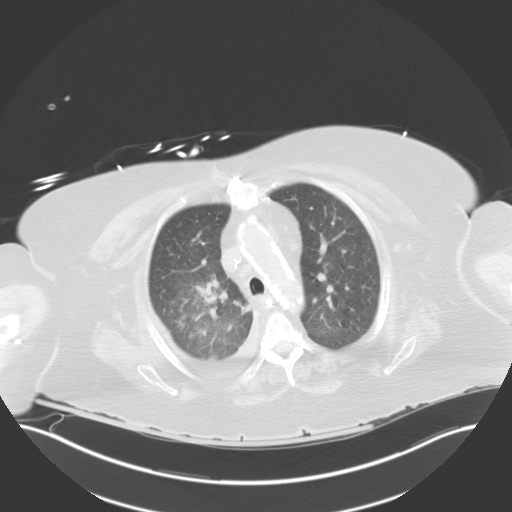
[im 49/58  lung]
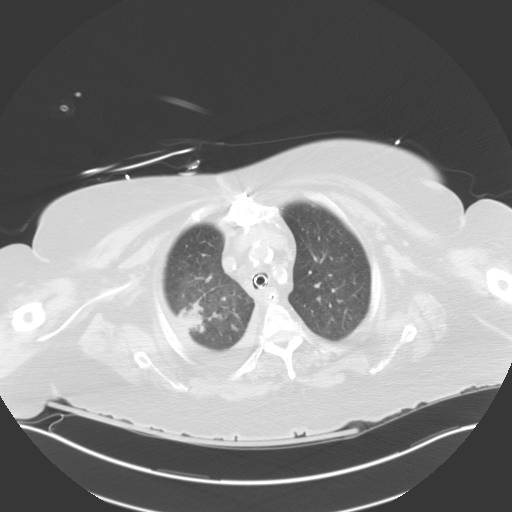
[im 53/58  lung]
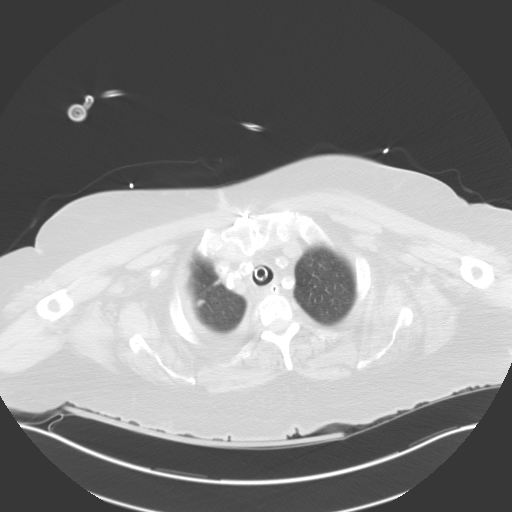

[Series 5: coronal · coronal · 0.59mm/px · 3 of 92 slices shown]
[im 19/92  lung]
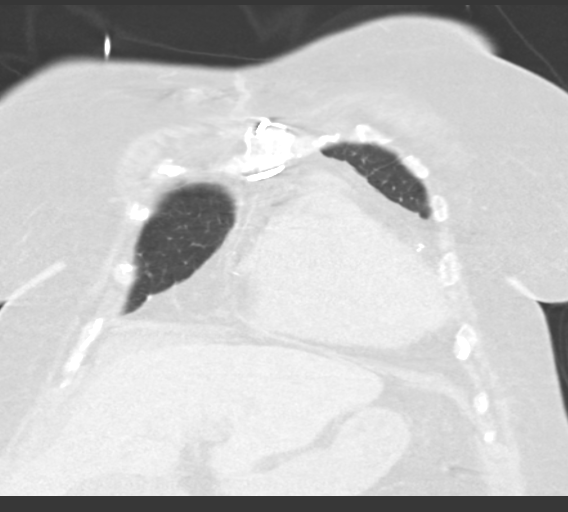
[im 37/92  lung]
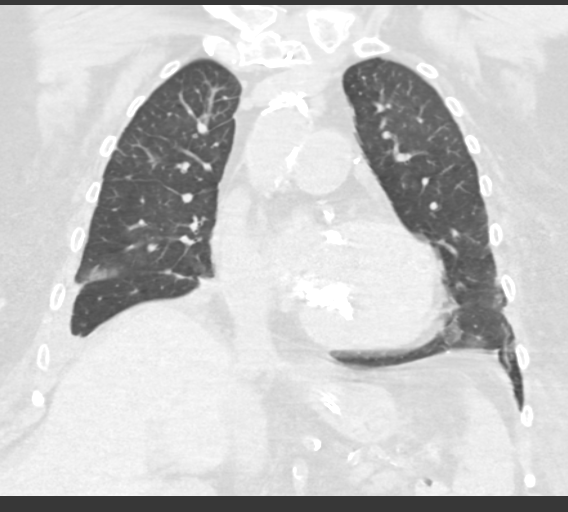
[im 55/92  lung]
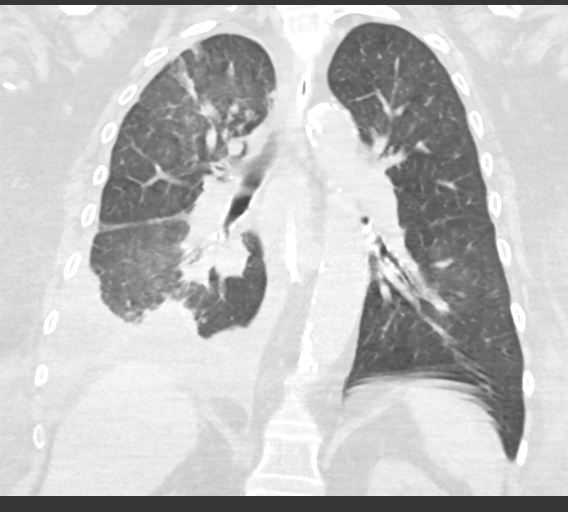

[15 of 36 positions shown; findings below may reference images not displayed]

FINDINGS: Mediastinum: Normal heart size. The patient is status post median
sternotomy and CABG procedure. Calcified atherosclerotic disease
involves the thoracic aorta as well as the Coronary arteries. There
is a ET tube with tip above the carina. The trachea is patent and
midline. Nasogastric tube is identified within the stomach. Right
paratracheal lymph node measures 1.1 cm, image 12/series 2. There is
another right paratracheal lymph node measured 1.1 cm, image
16/series 2.

Lungs/Pleura: Moderate right pleural effusion is identified with
overlie consolidation and atelectasis. There is a spiculated nodular
opacity within the right upper lobe measuring 2.3 x 2.7 cm, image
11/series 3. Patchy areas of ground-glass attenuation are identified
bilaterally. There is mild diffuse interlobular septal thickening
noted.

Upper Abdomen: The stress set there is bilateral adrenal gland
hyperplasia. The visualized portions of the liver and spleen are
normal.

Musculoskeletal: There is multi level spondylosis identified within
the thoracic spine.
IMPRESSION: 1. Moderate right pleural effusion with overlying compressive
atelectasis and airspace consolidation.
2. Spiculated nodular opacity in the right upper lobe is identified.
The appearance is nonspecific. Pulmonary neoplasm cannot be
excluded. Followup imaging is recommended after resolution of the
patient's acute episode. If this does not resolve then further
assessment with PET-CT and tissue sampling would be advised.
3. Cardiac enlargement, atherosclerotic disease and postoperative
changes of CABG procedure.
4. Evidence of mild congestive heart failure.
5. Borderline enlarged right paratracheal lymph nodes may be
reactive in the setting of pneumonia and are nonspecific in the
setting of CHF.

## 2016-08-31 IMAGING — CR DG CHEST 1V PORT
1 series · 1 of 1 positions shown · non-contrast
Comparison: Portable chest x-ray September 11, 2014

CLINICAL DATA: Right pleural effusion, status post thoracentesis

EXAM:
PORTABLE CHEST - 1 VIEW

[AP]
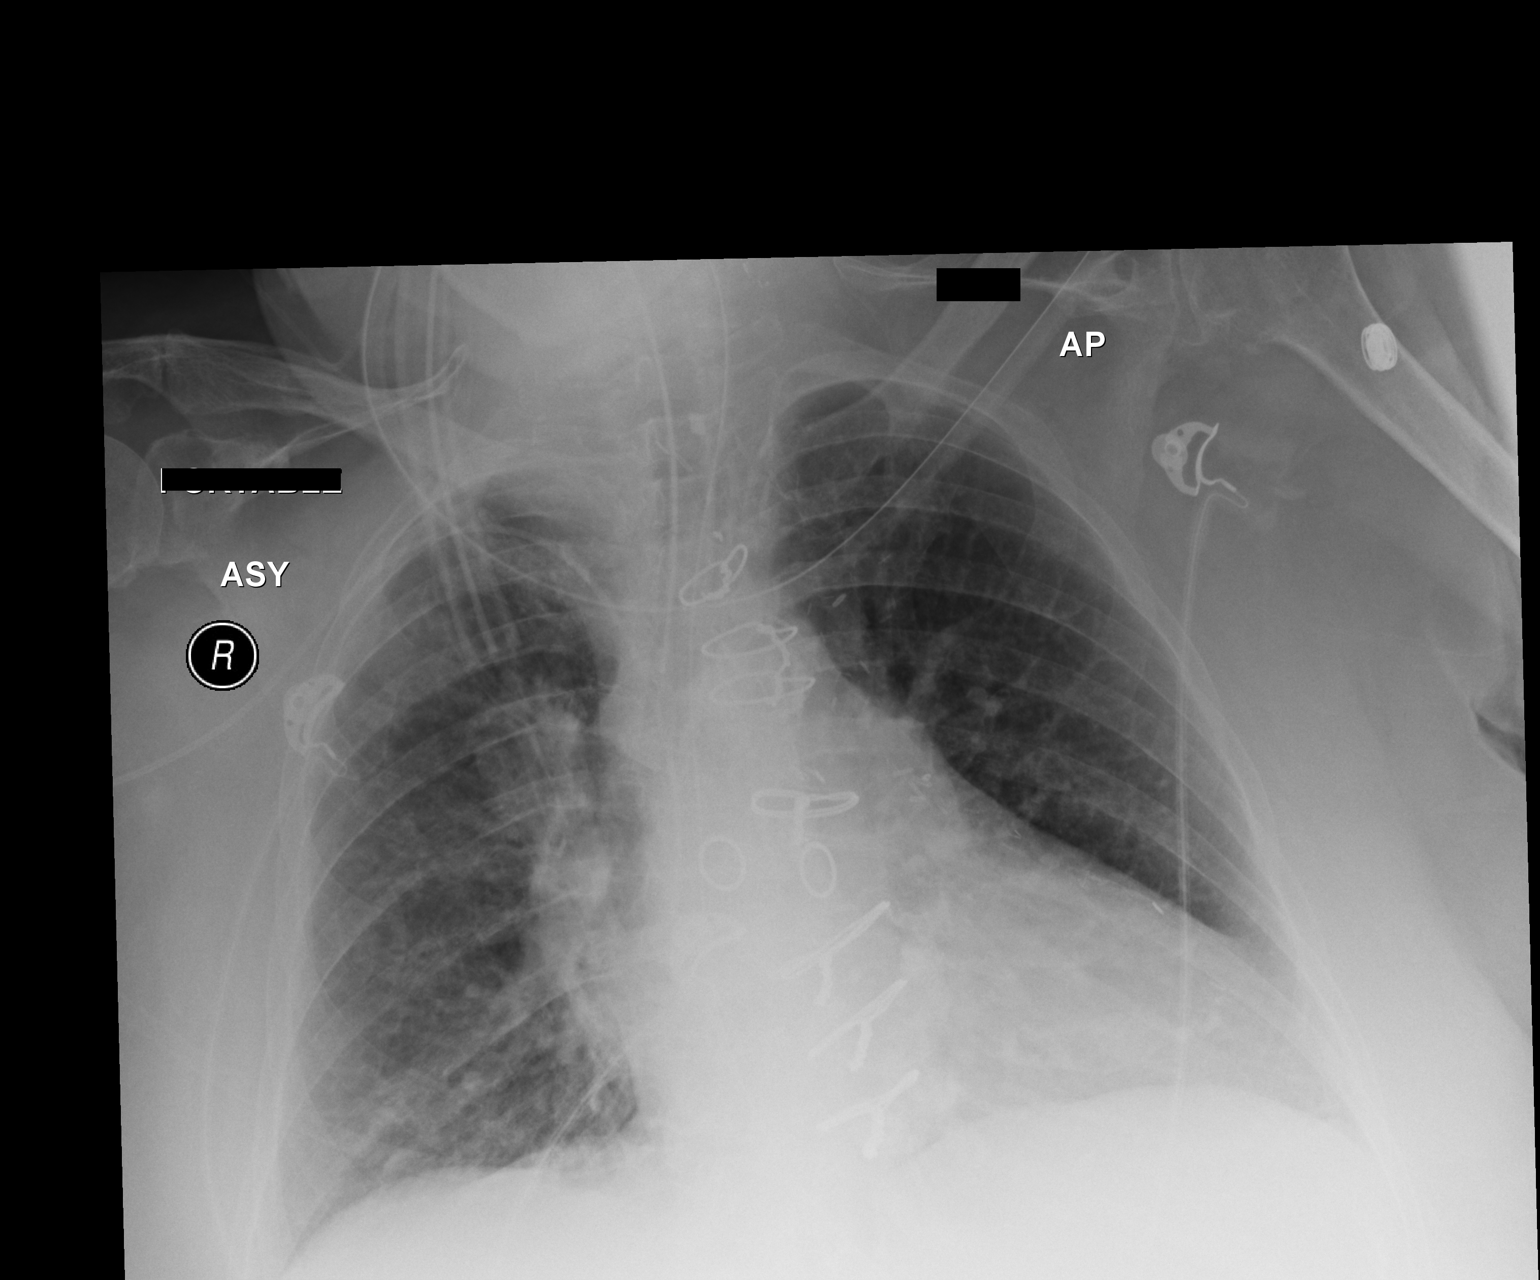

[1 of 1 positions shown; findings below may reference images not displayed]

FINDINGS: The lungs are adequately inflated. The interstitial markings are
less prominent today. No significant pleural effusion is
demonstrated. There is note pneumothorax. The cardiopericardial
silhouette remains enlarged. The central pulmonary vascularity
remains prominent centrally but the cephalization has decreased.
There is no pleural effusion.

The endotracheal tube tip lies approximately 2.6 cm above the crotch
of the carina. The esophagogastric tube tip projects below the
inferior margin of the image. The right-sided PICC line tip projects
over the midportion of the SVC.
IMPRESSION: 1. There is no evidence of a postprocedure complication following
right sided thoracentesis.
2. Interval improvement in the appearance of the pulmonary
interstitium consistent with resolving interstitial edema. Mild CHF
persists.
